# Patient Record
Sex: Female | Born: 1995 | Hispanic: No | Smoking: Former smoker
Health system: Southern US, Community
[De-identification: ages and names within clinical notes are randomized; demographics above are authoritative.]

## PROBLEM LIST (undated history)

## (undated) ENCOUNTER — Inpatient Hospital Stay (HOSPITAL_COMMUNITY): Payer: Self-pay

## (undated) DIAGNOSIS — B351 Tinea unguium: Secondary | ICD-10-CM

## (undated) DIAGNOSIS — F419 Anxiety disorder, unspecified: Secondary | ICD-10-CM

## (undated) DIAGNOSIS — N921 Excessive and frequent menstruation with irregular cycle: Secondary | ICD-10-CM

## (undated) DIAGNOSIS — E282 Polycystic ovarian syndrome: Secondary | ICD-10-CM

## (undated) DIAGNOSIS — E119 Type 2 diabetes mellitus without complications: Secondary | ICD-10-CM

## (undated) DIAGNOSIS — D649 Anemia, unspecified: Secondary | ICD-10-CM

## (undated) DIAGNOSIS — R635 Abnormal weight gain: Secondary | ICD-10-CM

## (undated) DIAGNOSIS — E785 Hyperlipidemia, unspecified: Secondary | ICD-10-CM

## (undated) HISTORY — DX: Anemia, unspecified: D64.9

## (undated) HISTORY — DX: Excessive and frequent menstruation with irregular cycle: N92.1

## (undated) HISTORY — PX: WISDOM TOOTH EXTRACTION: SHX21

## (undated) HISTORY — DX: Polycystic ovarian syndrome: E28.2

## (undated) HISTORY — DX: Abnormal weight gain: R63.5

## (undated) HISTORY — DX: Hyperlipidemia, unspecified: E78.5

## (undated) HISTORY — DX: Tinea unguium: B35.1

---
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
## 2015-04-04 DIAGNOSIS — D649 Anemia, unspecified: Secondary | ICD-10-CM

## 2015-04-04 HISTORY — DX: Anemia, unspecified: D64.9

## 2015-04-08 ENCOUNTER — Ambulatory Visit: Payer: Self-pay | Attending: Internal Medicine

## 2015-05-13 ENCOUNTER — Ambulatory Visit (INDEPENDENT_AMBULATORY_CARE_PROVIDER_SITE_OTHER): Payer: Self-pay | Admitting: Family Medicine

## 2015-05-13 ENCOUNTER — Encounter: Payer: Self-pay | Admitting: Family Medicine

## 2015-05-13 VITALS — BP 123/58 | HR 81 | Temp 98.7°F | Resp 16 | Ht 64.5 in | Wt 181.0 lb

## 2015-05-13 DIAGNOSIS — R635 Abnormal weight gain: Secondary | ICD-10-CM

## 2015-05-13 DIAGNOSIS — N921 Excessive and frequent menstruation with irregular cycle: Secondary | ICD-10-CM

## 2015-05-13 DIAGNOSIS — G4700 Insomnia, unspecified: Secondary | ICD-10-CM

## 2015-05-13 LAB — COMPLETE METABOLIC PANEL WITH GFR
ALT: 20 U/L (ref 6–29)
AST: 17 U/L (ref 10–30)
Albumin: 4.3 g/dL (ref 3.6–5.1)
Alkaline Phosphatase: 46 U/L (ref 33–115)
BILIRUBIN TOTAL: 0.4 mg/dL (ref 0.2–1.2)
BUN: 8 mg/dL (ref 7–25)
CALCIUM: 9.2 mg/dL (ref 8.6–10.2)
CO2: 21 mmol/L (ref 20–31)
CREATININE: 0.65 mg/dL (ref 0.50–1.10)
Chloride: 105 mmol/L (ref 98–110)
GFR, Est Non African American: >89 mL/min (ref >=60)
Glucose, Bld: 73 mg/dL (ref 65–99)
Potassium: 3.7 mmol/L (ref 3.5–5.3)
Sodium: 137 mmol/L (ref 135–146)
TOTAL PROTEIN: 7.5 g/dL (ref 6.1–8.1)

## 2015-05-13 LAB — TSH: TSH: 1.31 mIU/L

## 2015-05-13 LAB — CBC WITH DIFFERENTIAL/PLATELET
BASOS ABS: 0 10*3/uL (ref 0.0–0.1)
Basophils Relative: 0 % (ref 0–1)
EOS ABS: 0.1 10*3/uL (ref 0.0–0.7)
EOS PCT: 1 % (ref 0–5)
HCT: 34.9 % — ABNORMAL LOW (ref 36.0–46.0)
Hemoglobin: 11 g/dL — ABNORMAL LOW (ref 12.0–15.0)
LYMPHS ABS: 2.5 10*3/uL (ref 0.7–4.0)
Lymphocytes Relative: 29 % (ref 12–46)
MCH: 24.5 pg — AB (ref 26.0–34.0)
MCHC: 31.5 g/dL (ref 30.0–36.0)
MCV: 77.7 fL — AB (ref 78.0–100.0)
MONO ABS: 0.4 10*3/uL (ref 0.1–1.0)
MPV: 9.5 fL (ref 8.6–12.4)
Monocytes Relative: 5 % (ref 3–12)
Neutro Abs: 5.7 10*3/uL (ref 1.7–7.7)
Neutrophils Relative %: 65 % (ref 43–77)
PLATELETS: 412 10*3/uL — AB (ref 150–400)
RBC: 4.49 MIL/uL (ref 3.87–5.11)
RDW: 16.2 % — AB (ref 11.5–15.5)
WBC: 8.7 10*3/uL (ref 4.0–10.5)

## 2015-05-13 LAB — POCT URINALYSIS DIP (DEVICE)
Bilirubin Urine: NEGATIVE
GLUCOSE, UA: NEGATIVE mg/dL
Hgb urine dipstick: NEGATIVE
Ketones, ur: NEGATIVE mg/dL
LEUKOCYTES UA: NEGATIVE
NITRITE: NEGATIVE
Protein, ur: NEGATIVE mg/dL
SPECIFIC GRAVITY, URINE: 1.025 (ref 1.005–1.030)
UROBILINOGEN UA: 0.2 mg/dL (ref 0.0–1.0)
pH: 7 (ref 5.0–8.0)

## 2015-05-13 NOTE — Progress Notes (Signed)
Subjective:    Patient ID: Joy Wood, female    DOB: 09-09-1995, 20 y.o.   MRN: [id number]  HPI Joy Wood, a 20 year old female presents accompanied by sister to establish care. Patient maintains that she has not had a primary provider in the past several years. She is complaining of irregular menstrual cycle, weight gain, and insomnia. She states that menstrual cycles have been heavy and irregular since getting married 6 months ago. She states that cycles typically last 7-10 days and she does not recall the amount of pads and tampons utilized. She states that she is sexually active and has never been on birth control. She denies pelvic pain, dysuria, intolerance to heat/cold, constipation, palpitations, nausea, or vomiting. She also maintains that she has gained greater than 40 pounds over the past year. She does not report a history of hyperlipidemia, diabetes, or hypertension.  Past Medical History  Diagnosis Date  . Menorrhagia with irregular cycle   . Weight gain    Social History   Social History  . Marital Status: Single    Spouse Name: N/A  . Number of Children: N/A  . Years of Education: N/A   Occupational History  . Not on file.   Social History Main Topics  . Smoking status: Never Smoker   . Smokeless tobacco: Not on file  . Alcohol Use: No  . Drug Use: No  . Sexual Activity: Yes    Birth Control/ Protection: None   Other Topics Concern  . Not on file   Social History Narrative  . No narrative on file  No Known Allergies Review of Systems  Constitutional: Negative.  Negative for fever and fatigue.  HENT: Negative.   Eyes: Negative.   Respiratory: Negative.   Cardiovascular: Negative.   Gastrointestinal: Negative.   Endocrine: Positive for polyphagia. Negative for polydipsia and polyuria.  Musculoskeletal: Negative.   Skin: Negative.   Allergic/Immunologic: Negative for immunocompromised state.  Neurological: Negative.  Negative for dizziness.   Hematological: Negative.   Psychiatric/Behavioral: Negative.       Objective:   Physical Exam  Constitutional: She is oriented to person, place, and time. She appears well-developed and well-nourished.  HENT:  Head: Normocephalic and atraumatic.  Right Ear: External ear normal.  Left Ear: External ear normal.  Nose: Nose normal.  Mouth/Throat: Oropharynx is clear and moist.  Eyes: Conjunctivae and EOM are normal. Pupils are equal, round, and reactive to light.  Neck: Normal range of motion. Neck supple.  Cardiovascular: Normal rate, regular rhythm, normal heart sounds and intact distal pulses.   No murmur heard. Pulmonary/Chest: Effort normal and breath sounds normal.  Abdominal: Soft. Bowel sounds are normal.  Musculoskeletal: Normal range of motion.  Neurological: She is alert and oriented to person, place, and time. She has normal reflexes.  Skin: Skin is warm and dry.  Psychiatric: She has a normal mood and affect. Her behavior is normal. Judgment and thought content normal.      BP 123/58 mmHg  Pulse 81  Temp(Src) 98.7 F (37.1 C) (Oral)  Resp 16  Ht 5' 4.5" (1.638 m)  Wt 181 lb (82.101 kg)  BMI 30.60 kg/m2  LMP 04/20/2015 Assessment & Plan:  1. Weight gain Recommend a lowfat, low carbohydrate diet divided over 5-6 small meals, increase water intake to 6-8 glasses, and 150 minutes per week of cardiovascular exercise.  The patient is asked to make an attempt to improve diet and exercise patterns to aid in medical  management of this problem. - POCT urinalysis dipstick - COMPLETE METABOLIC PANEL WITH GFR - CBC with Differential - TSH - Hemoglobin A1c  2. Menorrhagia with irregular cycle  - FSH/LH  - Testosterone total  3. Insomnia Establish a sleep routine. Refrain from sleeping with television on. Refrain from using cell phone or computer in bed. Do not eat heavy meals within an hour of laying down. Patient expressed understanding.    Joy Maroon, FNP

## 2015-05-13 NOTE — Patient Instructions (Addendum)
Will call with laboratory values.  Recommend a lowfat, low carbohydrate diet divided over 5-6 small meals, increase water intake to 6-8 glasses, and 150 minutes per week of cardiovascular exercise.  Menorrhagia Menorrhagia is when your menstrual periods are heavy or last longer than usual.  HOME CARE  Only take medicine as told by your doctor.  Take any iron pills as told by your doctor. Heavy bleeding may cause low levels of iron in your body.  Do not take aspirin 1 week before or during your period. Aspirin can make the bleeding worse.  Lie down for a while if you change your tampon or pad more than once in 2 hours. This may help lessen the bleeding.  Eat a healthy diet and foods with iron. These foods include leafy green vegetables, meat, liver, eggs, and whole grain breads and cereals.  Do not try to lose weight. Wait until the heavy bleeding has stopped and your iron level is normal. GET HELP IF:  You soak through a pad or tampon every 1 or 2 hours, and this happens every time you have a period.  You need to use pads and tampons at the same time because you are bleeding so much.  You need to change your pad or tampon during the night.  You have a period that lasts for more than 8 days.  You pass clots bigger than 1 inch (2.5 cm) wide.  You have irregular periods that happen more or less often than once a month.  You feel dizzy or pass out (faint).  You feel very weak or tired.  You feel short of breath or feel your heart is beating too fast when you exercise.  You feel sick to your stomach (nausea) and you throw up (vomit) while you are taking your medicine.   You have watery poop (diarrhea) while you are taking your medicine.  You have any problems that may be related to the medicine you are taking.  GET HELP RIGHT AWAY IF:  You soak through 4 or more pads or tampons in 2 hours.  You have any bleeding while you are pregnant. MAKE SURE YOU:   Understand these  instructions.  Will watch your condition.  Will get help right away if you are not doing well or get worse.   This information is not intended to replace advice given to you by your health care provider. Make sure you discuss any questions you have with your health care provider.   Document Released: 12/28/2007 Document Revised: 11/20/2012 Document Reviewed: 09/19/2012 Elsevier Interactive Patient Education 2016 [employer]. Exercising to [employer] Exercising can help you to lose weight. In order to lose weight through exercise, you need to do vigorous-intensity exercise. You can tell that you are exercising with vigorous intensity if you are breathing very hard and fast and cannot hold a conversation while exercising. Moderate-intensity exercise helps to maintain your current weight. You can tell that you are exercising at a moderate level if you have a higher heart rate and faster breathing, but you are still able to hold a conversation. HOW OFTEN SHOULD I EXERCISE? Choose an activity that you enjoy and set realistic goals. Your health care provider can help you to make an activity plan that works for you. Exercise regularly as directed by your health care provider. This may include:  Doing resistance training twice each week, such as:  Push-ups.  Sit-ups.  Lifting weights.  Using resistance bands.  Doing a given intensity of  exercise for a given amount of time. Choose from these options:  150 minutes of moderate-intensity exercise every week.  75 minutes of vigorous-intensity exercise every week.  A mix of moderate-intensity and vigorous-intensity exercise every week. Children, pregnant women, people who are out of shape, people who are overweight, and older adults may need to consult a health care provider for individual recommendations. If you have any sort of medical condition, be sure to consult your health care provider before starting a new exercise program. WHAT ARE  SOME ACTIVITIES THAT CAN HELP ME TO LOSE WEIGHT?   Walking at a rate of at least 4.5 miles an hour.  Jogging or running at a rate of 5 miles per hour.  Biking at a rate of at least 10 miles per hour.  Lap swimming.  Roller-skating or in-line skating.  Cross-country skiing.  Vigorous competitive sports, such as football, basketball, and soccer.  Jumping rope.  Aerobic dancing. HOW CAN I BE MORE ACTIVE IN MY DAY-TO-DAY ACTIVITIES?  Use the stairs instead of the elevator.  Take a walk during your lunch break.  If you drive, park your car farther away from work or school.  If you take public transportation, get off one stop early and walk the rest of the way.  Make all of your phone calls while standing up and walking around.  Get up, stretch, and walk around every 30 minutes throughout the day. WHAT GUIDELINES SHOULD I FOLLOW WHILE EXERCISING?  Do not exercise so much that you hurt yourself, feel dizzy, or get very short of breath.  Consult your health care provider prior to starting a new exercise program.  Wear comfortable clothes and shoes with good support.  Drink plenty of water while you exercise to prevent dehydration or heat stroke. Body water is lost during exercise and must be replaced.  Work out until you breathe faster and your heart beats faster.   This information is not intended to replace advice given to you by your health care provider. Make sure you discuss any questions you have with your health care provider.   Document Released: 04/22/2010 Document Revised: 04/10/2014 Document Reviewed: 08/21/2013 Elsevier Interactive Patient Education 2016 [employer]. Calorie Counting for [employer] Loss Calories are energy you get from the things you eat and drink. Your body uses this energy to keep you going throughout the day. The number of calories you eat affects your weight. When you eat more calories than your body needs, your body stores the extra calories as  fat. When you eat fewer calories than your body needs, your body burns fat to get the energy it needs. Calorie counting means keeping track of how many calories you eat and drink each day. If you make sure to eat fewer calories than your body needs, you should lose weight. In order for calorie counting to work, you will need to eat the number of calories that are right for you in a day to lose a healthy amount of weight per week. A healthy amount of weight to lose per week is usually 1-2 lb (0.5-0.9 kg). A dietitian can determine how many calories you need in a day and give you suggestions on how to reach your calorie goal.  WHAT IS MY MY PLAN? My goal is to have __________ calories per day.  If I have this many calories per day, I should lose around __________ pounds per week. WHAT DO I NEED TO KNOW ABOUT CALORIE COUNTING? In order to meet your  daily calorie goal, you will need to:  Find out how many calories are in each food you would like to eat. Try to do this before you eat.  Decide how much of the food you can eat.  Write down what you ate and how many calories it had. Doing this is called keeping a food log. WHERE DO I FIND CALORIE INFORMATION? The number of calories in a food can be found on a Nutrition Facts label. Note that all the information on a label is based on a specific serving of the food. If a food does not have a Nutrition Facts label, try to look up the calories online or ask your dietitian for help. HOW DO I DECIDE HOW MUCH TO EAT? To decide how much of the food you can eat, you will need to consider both the number of calories in one serving and the size of one serving. This information can be found on the Nutrition Facts label. If a food does not have a Nutrition Facts label, look up the information online or ask your dietitian for help. Remember that calories are listed per serving. If you choose to have more than one serving of a food, you will have to multiply the calories  per serving by the amount of servings you plan to eat. For example, the label on a package of bread might say that a serving size is 1 slice and that there are 90 calories in a serving. If you eat 1 slice, you will have eaten 90 calories. If you eat 2 slices, you will have eaten 180 calories. HOW DO I KEEP A FOOD LOG? After each meal, record the following information in your food log:  What you ate.  How much of it you ate.  How many calories it had.  Then, add up your calories. Keep your food log near you, such as in a small notebook in your pocket. Another option is to use a mobile app or website. Some programs will calculate calories for you and show you how many calories you have left each time you add an item to the log. WHAT ARE SOME CALORIE COUNTING TIPS?  Use your calories on foods and drinks that will fill you up and not leave you hungry. Some examples of this include foods like nuts and nut butters, vegetables, lean proteins, and high-fiber foods (more than 5 g fiber per serving).  Eat nutritious foods and avoid empty calories. Empty calories are calories you get from foods or beverages that do not have many nutrients, such as candy and soda. It is better to have a nutritious high-calorie food (such as an avocado) than a food with few nutrients (such as a bag of chips).  Know how many calories are in the foods you eat most often. This way, you do not have to look up how many calories they have each time you eat them.  Look out for foods that may seem like low-calorie foods but are really high-calorie foods, such as baked goods, soda, and fat-free candy.  Pay attention to calories in drinks. Drinks such as sodas, specialty coffee drinks, alcohol, and juices have a lot of calories yet do not fill you up. Choose low-calorie drinks like water and diet drinks.  Focus your calorie counting efforts on higher calorie items. Logging the calories in a garden salad that contains only  vegetables is less important than calculating the calories in a milk shake.  Find a way of tracking calories  that works for you. Get creative. Most people who are successful find ways to keep track of how much they eat in a day, even if they do not count every calorie. WHAT ARE SOME PORTION CONTROL TIPS?  Know how many calories are in a serving. This will help you know how many servings of a certain food you can have.  Use a measuring cup to measure serving sizes. This is helpful when you start out. With time, you will be able to estimate serving sizes for some foods.  Take some time to put servings of different foods on your favorite plates, bowls, and cups so you know what a serving looks like.  Try not to eat straight from a bag or box. Doing this can lead to overeating. Put the amount you would like to eat in a cup or on a plate to make sure you are eating the right portion.  Use smaller plates, glasses, and bowls to prevent overeating. This is a quick and easy way to practice portion control. If your plate is smaller, less food can fit on it.  Try not to multitask while eating, such as watching TV or using your computer. If it is time to eat, sit down at a table and enjoy your food. Doing this will help you to start recognizing when you are full. It will also make you more aware of what and how much you are eating. HOW CAN I CALORIE COUNT WHEN EATING OUT?  Ask for smaller portion sizes or child-sized portions.  Consider sharing an entree and sides instead of getting your own entree.  If you get your own entree, eat only half. Ask for a box at the beginning of your meal and put the rest of your entree in it so you are not tempted to eat it.  Look for the calories on the menu. If calories are listed, choose the lower calorie options.  Choose dishes that include vegetables, fruits, whole grains, low-fat dairy products, and lean protein. Focusing on smart food choices from each of the 5  food groups can help you stay on track at restaurants.  Choose items that are boiled, broiled, grilled, or steamed.  Choose water, milk, unsweetened iced tea, or other drinks without added sugars. If you want an alcoholic beverage, choose a lower calorie option. For example, a regular margarita can have up to 700 calories and a glass of wine has around 150.  Stay away from items that are buttered, battered, fried, or served with cream sauce. Items labeled "crispy" are usually fried, unless stated otherwise.  Ask for dressings, sauces, and syrups on the side. These are usually very high in calories, so do not eat much of them.  Watch out for salads. Many people think salads are a healthy option, but this is often not the case. Many salads come with bacon, fried chicken, lots of cheese, fried chips, and dressing. All of these items have a lot of calories. If you want a salad, choose a garden salad and ask for grilled meats or steak. Ask for the dressing on the side, or ask for olive oil and vinegar or lemon to use as dressing.  Estimate how many servings of a food you are given. For example, a serving of cooked rice is  cup or about the size of half a tennis ball or one cupcake wrapper. Knowing serving sizes will help you be aware of how much food you are eating at restaurants. The list below  tells you how big or small some common portion sizes are based on everyday objects.  1 oz--4 stacked dice.  3 oz--1 deck of cards.  1 tsp--1 dice.  1 Tbsp-- a Ping-Pong ball.  2 Tbsp--1 Ping-Pong ball.   cup--1 tennis ball or 1 cupcake wrapper.  1 cup--1 baseball.   This information is not intended to replace advice given to you by your health care provider. Make sure you discuss any questions you have with your health care provider.   Document Released: 03/20/2005 Document Revised: 04/10/2014 Document Reviewed: 01/23/2013 Elsevier Interactive Patient Education 2016 [employer] Inc[employer] Diet for  Metabolic Syndrome Metabolic syndrome is a disorder that includes at least three of these conditions:  Abdominal obesity.  Too much sugar in your blood.  High blood pressure.  Higher than normal amount of fat (lipids) in your blood.  Lower than normal level of "good" cholesterol (HDL). Following a healthy diet can help to keep metabolic syndrome under control. It can also help to prevent the development of conditions that are associated with metabolic syndrome, such as diabetes, heart disease, and stroke. Along with exercise, a healthy diet:  Helps to improve the way that the body uses insulin.  Promotes weight loss. A common goal for people with this condition is to lose at least 7 to 10 percent of their starting weight. WHAT DO I NEED TO KNOW ABOUT THIS DIET?  Use the glycemic index (GI) to plan your meals. The index tells you how quickly a food will raise your blood sugar. Choose foods that have low GI values. These foods take a longer time to raise blood sugar.  Keep track of how many calories you take in. Eating the right amount of calories will help your achieve a healthy weight.  You may want to follow a Mediterranean diet. This diet includes lots of vegetables, lean meats or fish, whole grains, fruits, and healthy oils and fats. WHAT FOODS CAN I EAT? Grains Stone-ground whole wheat. Pumpernickel bread. Whole-grain bread, crackers, tortillas, cereal, and pasta. Unsweetened oatmeal.Bulgur.Barley.Quinoa.Brown rice or wild rice. Vegetables Lettuce. Spinach. Peas. Beets. Cauliflower. Cabbage. Broccoli. Carrots. Tomatoes. Squash. Eggplant. Herbs. Peppers. Onions. Cucumbers. Brussels sprouts. Sweet potatoes. Yams. Beans. Lentils. Fruits Berries. Apples. Oranges. Grapes. Mango. Pomegranate. Kiwi. Cherries. Meats and Other Protein Sources Seafood and shellfish. Lean meats.Poultry. Tofu. Dairy Low-fat or fat-free dairy products, such as milk, yogurt, and  cheese. Beverages Water. Low-fat milk. Milk alternatives, like soy milk or almond milk. Real fruit juice. Condiments Low-sugar or sugar-free ketchup, barbecue sauce, and mayonnaise. Mustard. Relish. Fats and Oils Avocado. Canola or olive oil. Nuts and nut butters.Seeds. The items listed above may not be a complete list of recommended foods or beverages. Contact your dietitian for more options.  WHAT FOODS ARE NOT RECOMMENDED? Red meat. Palm oil and coconut oil. Processed foods. Fried foods. Alcohol. Sweetened drinks, such as iced tea and soda. Sweets. Salty foods. The items listed above may not be a complete list of foods and beverages to avoid. Contact your dietitian for more information.   This information is not intended to replace advice given to you by your health care provider. Make sure you discuss any questions you have with your health care provider.   Document Released: 08/04/2014 Document Reviewed: 08/04/2014 Elsevier Interactive Patient Education [employer]

## 2015-05-14 ENCOUNTER — Telehealth: Payer: Self-pay

## 2015-05-14 LAB — FSH/LH
FSH: 4.5 m[IU]/mL
LH: 3.8 m[IU]/mL

## 2015-05-14 LAB — HEMOGLOBIN A1C
Hgb A1c MFr Bld: 5.7 % — ABNORMAL HIGH (ref <5.7)
MEAN PLASMA GLUCOSE: 117 mg/dL — AB (ref <117)

## 2015-05-14 NOTE — Telephone Encounter (Signed)
Called and patient was not avaliable at this time. Will try later. Thanks!

## 2015-05-14 NOTE — Telephone Encounter (Signed)
-----   Message from [name], [place] sent at 05/14/2015 12:50 PM EST ----- Regarding: lab results Please inform Ms. Mccartney that hemoglobin a1C is 5.7, which is consistent with prediabetes. Recommend a lowfat, low carbohydrate diet divided over 5-6 small meals, increase water intake to 6-8 glasses, and 150 minutes per week of cardiovascular exercise.  Also, hemoglobin is mildly decreased at 11, a normal range is 12.5. Recommend that she increase green, leafy veggies and beans. Add a multivitamin to daily regimen.   [name], FNP ----- Message -----    From: [facility] in [facility]    Sent: 05/14/2015   1:02 AM      To: [name], FNP

## 2015-05-17 NOTE — Telephone Encounter (Signed)
Called and spoke with patient, advised of labs and china's recommendations. Patient states understanding and will comply. Thanks!

## 2015-05-19 ENCOUNTER — Telehealth: Payer: Self-pay | Admitting: Internal Medicine

## 2015-05-19 NOTE — Telephone Encounter (Signed)
Patient would like to be referred to [facility] , [address], [place].

## 2015-05-20 NOTE — Telephone Encounter (Signed)
This has been faxed. Thanks!

## 2015-06-14 ENCOUNTER — Telehealth: Payer: Self-pay | Admitting: *Deleted

## 2015-06-14 NOTE — Telephone Encounter (Signed)
Pt called and stated that she would like a referral for an eye exam. Patient was advised to call back in a week if she hasn't received a call by then.

## 2015-06-14 NOTE — Telephone Encounter (Signed)
[facility]. Thanks!

## 2015-07-29 ENCOUNTER — Encounter: Payer: Self-pay | Admitting: Family Medicine

## 2015-07-29 ENCOUNTER — Other Ambulatory Visit (HOSPITAL_COMMUNITY)
Admission: RE | Admit: 2015-07-29 | Discharge: 2015-07-29 | Disposition: A | Discharge status: Home / Self Care | Payer: No Typology Code available for payment source | Source: Ambulatory Visit | Attending: Family Medicine | Admitting: Family Medicine

## 2015-07-29 ENCOUNTER — Ambulatory Visit (INDEPENDENT_AMBULATORY_CARE_PROVIDER_SITE_OTHER): Payer: No Typology Code available for payment source | Admitting: Family Medicine

## 2015-07-29 VITALS — BP 117/53 | HR 81 | Temp 99.1°F | Resp 16 | Ht 64.0 in | Wt 185.0 lb

## 2015-07-29 DIAGNOSIS — N760 Acute vaginitis: Secondary | ICD-10-CM | POA: Insufficient documentation

## 2015-07-29 DIAGNOSIS — Z113 Encounter for screening for infections with a predominantly sexual mode of transmission: Secondary | ICD-10-CM | POA: Insufficient documentation

## 2015-07-29 DIAGNOSIS — Z01419 Encounter for gynecological examination (general) (routine) without abnormal findings: Secondary | ICD-10-CM

## 2015-07-29 DIAGNOSIS — K0889 Other specified disorders of teeth and supporting structures: Secondary | ICD-10-CM

## 2015-07-29 NOTE — Progress Notes (Signed)
Subjective:    Patient ID: Joy Wood, female    DOB: 12-Mar-1996, 20 y.o.   MRN: [id number]  Gynecologic Exam The patient's pertinent negatives include no genital itching, genital lesions, genital odor, genital rash, missed menses, pelvic pain, vaginal bleeding or vaginal discharge. This is a new problem. The patient is experiencing no pain. She is not pregnant. Pertinent negatives include no abdominal pain, anorexia, back pain, chills, constipation, diarrhea, discolored urine, dysuria, fever, flank pain, frequency, headaches, hematuria, joint pain, joint swelling, nausea, painful intercourse, rash, sore throat, urgency or vomiting. She is sexually active. No, her partner does not have an STD. She uses nothing for contraception. Her menstrual history has been regular. There is no history of an abdominal surgery, a Cesarean section, an ectopic pregnancy, endometriosis, a gynecological surgery, menorrhagia, metrorrhagia, ovarian cysts, perineal abscess, PID, an STD, a terminated pregnancy or vaginosis.   Past Medical History  Diagnosis Date  . Menorrhagia with irregular cycle   . Weight gain   No Known Allergies   There is no immunization history on file for this patient.  Social History   Social History  . Marital Status: Single    Spouse Name: N/A  . Number of Children: N/A  . Years of Education: N/A   Occupational History  . Not on file.   Social History Main Topics  . Smoking status: Never Smoker   . Smokeless tobacco: Not on file  . Alcohol Use: No  . Drug Use: No  . Sexual Activity: Yes    Birth Control/ Protection: None   Other Topics Concern  . Not on file   Social History Narrative  . No narrative on file   Review of Systems  Constitutional: Negative for fever and chills.  HENT: Negative.  Negative for sore throat.   Eyes: Negative.   Respiratory: Negative.   Gastrointestinal: Negative for nausea, vomiting, abdominal pain, diarrhea, constipation and anorexia.   Endocrine: Negative.   Genitourinary: Negative.  Negative for dysuria, urgency, frequency, hematuria, flank pain, vaginal discharge, pelvic pain, menorrhagia and missed menses.  Musculoskeletal: Negative for back pain and joint pain.  Skin: Negative for rash.  Allergic/Immunologic: Negative.   Neurological: Negative.  Negative for headaches.       Objective:   Physical Exam  Constitutional: She appears well-developed and well-nourished.  HENT:  Head: Normocephalic and atraumatic.  Right Ear: External ear normal.  Left Ear: External ear normal.  Nose: Nose normal.  Mouth/Throat: Oropharynx is clear and moist.  Eyes: Conjunctivae and EOM are normal. Pupils are equal, round, and reactive to light.  Neck: Normal range of motion. Neck supple.  Cardiovascular: Normal rate, regular rhythm, normal heart sounds and intact distal pulses.   Pulmonary/Chest: Effort normal and breath sounds normal.  Abdominal: Soft. Bowel sounds are normal. Hernia confirmed negative in the right inguinal area and confirmed negative in the left inguinal area.  Genitourinary: No labial fusion. There is rash (Post shaving irritation; razor bumps) on the right labia. There is no tenderness, lesion or injury on the right labia. There is rash on the left labia. There is no tenderness, lesion or injury on the left labia. Uterus is not deviated, not enlarged, not fixed and not tender. Cervix exhibits friability. Left adnexum displays no mass. No erythema or tenderness in the vagina. Vaginal discharge found.  Skin: Skin is warm and dry.       BP 117/53 mmHg  Pulse 81  Temp(Src) 99.1 F (37.3 C) (Oral)  Resp  16  Ht 5\' 4"  (1.626 m)  Wt 185 lb (83.915 kg)  BMI 31.74 kg/m2  SpO2 100%  LMP 07/13/2015 Assessment & Plan:  1. Encounter for routine gynecological examination Will notify patient with laboratory results by phone - Cytology - [facility]  2. Pain, dental  - Ambulatory referral to  Dentistry   Routine Health Maintenance:  Reviewed vaccinations per [facility], will update chart.   Recommend a lowfat, low carbohydrate diet divided over 5-6 small meals, increase water intake to 6-8 glasses, and 150 minutes per week of cardiovascular exercise.    RTC: Will follow-up by phone with laboratory results.  Massie MaroonHollis,Riki Gehring M, FNP

## 2015-07-29 NOTE — Patient Instructions (Signed)
Health Maintenance, Female Adopting a healthy lifestyle and getting preventive care can go a long way to promote health and wellness. Talk with your health care provider about what schedule of regular examinations is right for you. This is a good chance for you to check in with your provider about disease prevention and staying healthy. In between checkups, there are plenty of things you can do on your own. Experts have done a lot of research about which lifestyle changes and preventive measures are most likely to keep you healthy. Ask your health care provider for more information. WEIGHT AND DIET  Eat a healthy diet  Be sure to include plenty of vegetables, fruits, low-fat dairy products, and lean protein.  Do not eat a lot of foods high in solid fats, added sugars, or salt.  Get regular exercise. This is one of the most important things you can do for your health.  Most adults should exercise for at least 150 minutes each week. The exercise should increase your heart rate and make you sweat (moderate-intensity exercise).  Most adults should also do strengthening exercises at least twice a week. This is in addition to the moderate-intensity exercise.  Maintain a healthy weight  Body mass index (BMI) is a measurement that can be used to identify possible weight problems. It estimates body fat based on height and weight. Your health care provider can help determine your BMI and help you achieve or maintain a healthy weight.  For females 20 years of age and older:   A BMI below 18.5 is considered underweight.  A BMI of 18.5 to 24.9 is normal.  A BMI of 25 to 29.9 is considered overweight.  A BMI of 30 and above is considered obese.  Watch levels of cholesterol and blood lipids  You should start having your blood tested for lipids and cholesterol at 20 years of age, then have this test every 5 years.  You may need to have your cholesterol levels checked more often if:  Your lipid  or cholesterol levels are high.  You are older than 21 years of age.  You are at high risk for heart disease.  CANCER SCREENING   Lung Cancer  Lung cancer screening is recommended for adults 55-80 years old who are at high risk for lung cancer because of a history of smoking.  A yearly low-dose CT scan of the lungs is recommended for people who:  Currently smoke.  Have quit within the past 15 years.  Have at least a 30-pack-year history of smoking. A pack year is smoking an average of one pack of cigarettes a day for 1 year.  Yearly screening should continue until it has been 15 years since you quit.  Yearly screening should stop if you develop a health problem that would prevent you from having lung cancer treatment.  Breast Cancer  Practice breast self-awareness. This means understanding how your breasts normally appear and feel.  It also means doing regular breast self-exams. Let your health care provider know about any changes, no matter how small.  If you are in your 20s or 30s, you should have a clinical breast exam (CBE) by a health care provider every 1-3 years as part of a regular health exam.  If you are 40 or older, have a CBE every year. Also consider having a breast X-ray (mammogram) every year.  If you have a family history of breast cancer, talk to your health care provider about genetic screening.  If you   are at high risk for breast cancer, talk to your health care provider about having an MRI and a mammogram every year.  Breast cancer gene (BRCA) assessment is recommended for women who have family members with BRCA-related cancers. BRCA-related cancers include:  Breast.  Ovarian.  Tubal.  Peritoneal cancers.  Results of the assessment will determine the need for genetic counseling and BRCA1 and BRCA2 testing. Cervical Cancer Your health care provider may recommend that you be screened regularly for cancer of the pelvic organs (ovaries, uterus, and  vagina). This screening involves a pelvic examination, including checking for microscopic changes to the surface of your cervix (Pap test). You may be encouraged to have this screening done every 3 years, beginning at age 21.  For women ages 30-65, health care providers may recommend pelvic exams and Pap testing every 3 years, or they may recommend the Pap and pelvic exam, combined with testing for human papilloma virus (HPV), every 5 years. Some types of HPV increase your risk of cervical cancer. Testing for HPV may also be done on women of any age with unclear Pap test results.  Other health care providers may not recommend any screening for nonpregnant women who are considered low risk for pelvic cancer and who do not have symptoms. Ask your health care provider if a screening pelvic exam is right for you.  If you have had past treatment for cervical cancer or a condition that could lead to cancer, you need Pap tests and screening for cancer for at least 20 years after your treatment. If Pap tests have been discontinued, your risk factors (such as having a new sexual partner) need to be reassessed to determine if screening should resume. Some women have medical problems that increase the chance of getting cervical cancer. In these cases, your health care provider may recommend more frequent screening and Pap tests. Colorectal Cancer  This type of cancer can be detected and often prevented.  Routine colorectal cancer screening usually begins at 21 years of age and continues through 21 years of age.  Your health care provider may recommend screening at an earlier age if you have risk factors for colon cancer.  Your health care provider may also recommend using home test kits to check for hidden blood in the stool.  A small camera at the end of a tube can be used to examine your colon directly (sigmoidoscopy or colonoscopy). This is done to check for the earliest forms of colorectal  cancer.  Routine screening usually begins at age 50.  Direct examination of the colon should be repeated every 5-10 years through 21 years of age. However, you may need to be screened more often if early forms of precancerous polyps or small growths are found. Skin Cancer  Check your skin from head to toe regularly.  Tell your health care provider about any new moles or changes in moles, especially if there is a change in a mole's shape or color.  Also tell your health care provider if you have a mole that is larger than the size of a pencil eraser.  Always use sunscreen. Apply sunscreen liberally and repeatedly throughout the day.  Protect yourself by wearing long sleeves, pants, a wide-brimmed hat, and sunglasses whenever you are outside. HEART DISEASE, DIABETES, AND HIGH BLOOD PRESSURE   High blood pressure causes heart disease and increases the risk of stroke. High blood pressure is more likely to develop in:  People who have blood pressure in the high end   of the normal range (130-139/85-89 mm Hg).  People who are overweight or obese.  People who are African American.  If you are 38-23 years of age, have your blood pressure checked every 3-5 years. If you are 61 years of age or older, have your blood pressure checked every year. You should have your blood pressure measured twice--once when you are at a hospital or clinic, and once when you are not at a hospital or clinic. Record the average of the two measurements. To check your blood pressure when you are not at a hospital or clinic, you can use:  An automated blood pressure machine at a pharmacy.  A home blood pressure monitor.  If you are between 45 years and 39 years old, ask your health care provider if you should take aspirin to prevent strokes.  Have regular diabetes screenings. This involves taking a blood sample to check your fasting blood sugar level.  If you are at a normal weight and have a low risk for diabetes,  have this test once every three years after 21 years of age.  If you are overweight and have a high risk for diabetes, consider being tested at a younger age or more often. PREVENTING INFECTION  Hepatitis B  If you have a higher risk for hepatitis B, you should be screened for this virus. You are considered at high risk for hepatitis B if:  You were born in a country where hepatitis B is common. Ask your health care provider which countries are considered high risk.  Your parents were born in a high-risk country, and you have not been immunized against hepatitis B (hepatitis B vaccine).  You have HIV or AIDS.  You use needles to inject street drugs.  You live with someone who has hepatitis B.  You have had sex with someone who has hepatitis B.  You get hemodialysis treatment.  You take certain medicines for conditions, including cancer, organ transplantation, and autoimmune conditions. Hepatitis C  Blood testing is recommended for:  Everyone born from [birth year] through 1966.  Anyone with known risk factors for hepatitis C. Sexually transmitted infections (STIs)  You should be screened for sexually transmitted infections (STIs) including gonorrhea and chlamydia if:  You are sexually active and are younger than 21 years of age.  You are older than 21 years of age and your health care provider tells you that you are at risk for this type of infection.  Your sexual activity has changed since you were last screened and you are at an increased risk for chlamydia or gonorrhea. Ask your health care provider if you are at risk.  If you do not have HIV, but are at risk, it may be recommended that you take a prescription medicine daily to prevent HIV infection. This is called pre-exposure prophylaxis (PrEP). You are considered at risk if:  You are sexually active and do not regularly use condoms or know the HIV status of your partner(s).  You take drugs by injection.  You are sexually  active with a partner who has HIV. Talk with your health care provider about whether you are at high risk of being infected with HIV. If you choose to begin PrEP, you should first be tested for HIV. You should then be tested every 3 months for as long as you are taking PrEP.  PREGNANCY   If you are premenopausal and you may become pregnant, ask your health care provider about preconception counseling.  If you may  become pregnant, take 400 to 800 micrograms (mcg) of folic acid every day.  If you want to prevent pregnancy, talk to your health care provider about birth control (contraception). OSTEOPOROSIS AND MENOPAUSE   Osteoporosis is a disease in which the bones lose minerals and strength with aging. This can result in serious bone fractures. Your risk for osteoporosis can be identified using a bone density scan.  If you are 35 years of age or older, or if you are at risk for osteoporosis and fractures, ask your health care provider if you should be screened.  Ask your health care provider whether you should take a calcium or vitamin D supplement to lower your risk for osteoporosis.  Menopause may have certain physical symptoms and risks.  Hormone replacement therapy may reduce some of these symptoms and risks. Talk to your health care provider about whether hormone replacement therapy is right for you.  HOME CARE INSTRUCTIONS   Schedule regular health, dental, and eye exams.  Stay current with your immunizations.   Do not use any tobacco products including cigarettes, chewing tobacco, or electronic cigarettes.  If you are pregnant, do not drink alcohol.  If you are breastfeeding, limit how much and how often you drink alcohol.  Limit alcohol intake to no more than 1 drink per day for nonpregnant women. One drink equals 12 ounces of beer, 5 ounces of wine, or 1 ounces of hard liquor.  Do not use street drugs.  Do not share needles.  Ask your health care provider for help if  you need support or information about quitting drugs.  Tell your health care provider if you often feel depressed.  Tell your health care provider if you have ever been abused or do not feel safe at home.   This information is not intended to replace advice given to you by your health care provider. Make sure you discuss any questions you have with your health care provider.   Document Released: 10/03/2010 Document Revised: 04/10/2014 Document Reviewed: 02/19/2013 Elsevier Interactive Patient Education 2016 [employer] Pelvic Exam A pelvic exam is an exam of a woman's outer and inner genitals and reproductive organs. The first pelvic exam should be at age 85. Pelvic exams allow your health care provider to check on normal development and screen for health problems. Usually, a general physical exam is done first. An exam of the breasts is also done. At this visit, you can ask questions about your health, body, menstrual cycles, sex, and birth control methods. Your health care provider will also ask you questions about your health, family health, menstrual periods, immunizations, and if you are sexually active. The information shared between you and your health care provider is not shared with anyone else. WHAT ARE THE REASONS TO HAVE A PELVIC EXAM? One reason for a pelvic exam is to screen for cancer of the ovaries, uterus, and vagina (pelvic organs). Annual (once a year) pelvic exams to screen for cancer are no longer recommended for nonpregnant women who are considered low risk for cancer of the pelvic organs and who do not have symptoms. These are sometimes called Pap tests. For low-risk women, pelvic exam cancer screening should be done:  Every 3 years for women ages 21-29.  Every 3 years for women ages 58-65, or every 5 years in combination with screening for human papilloma virus (HPV). Some types of HPV increase your risk of cervical cancer. Testing for HPV may also be done on women of any  age  with unclear Pap test results.  Some women have medical problems that increase the chance of getting cervical cancer. Talk to your health care provider about these problems. It is especially important to talk to your health care provider if a new problem develops soon after your last Pap test. In these cases, your health care provider may recommend more frequent screening and Pap tests.  The above recommendations are the same for women who have or have not gotten the vaccine for HPV, or human papillomavirus.  If you had a complete hysterectomy for a problem that was not cancer or a condition that could lead to cancer, then you no longer need Pap tests. However, even if you no longer need a Pap test, a regular exam is a good idea to make sure no other problems are starting.   If you have had past treatment for cervical cancer or a condition that could lead to cancer, you need annual Pap tests and screening for cancer for at least 20 years after your treatment.  If you are no longer receiving Pap tests, risk factors (such as a new sexual partner)need to be discussed with your health care provider to determine if screening should be started again.  Some health care providers may not recommend any screening for nonpregnant women who are considered low risk for pelvic cancer and who do not have symptoms. Ask your health care provider if a screening pelvic exam is right for you. Other reasons your health care provider might recommend a pelvic exam could include:   If you are at high risk for cervical cancer.  To make sure your female organs are normal and functioning correctly.  To check on body changes that suggest a reproductive system cancer.  To explore why you are not able to get pregnant (infertility).  To find a cause for vaginal discharge, itching, or burning.  To look for causes of why you cannot hold your urine (urinary incontinence).  To look for causes of sexual  problems.  To look for signs of sexually transmitted infection (STI).  To follow the progression of labor. Your health care provider can check on the baby and how far your cervix has opened.  To determine if pregnancy is present or how far advanced the pregnancy is.  If you have:  severe cramping or pain during your menstrual periods.  pain during sexual intercourse.  abnormal menstrual periods.  no menstrual period by the age of 97. HOW IS A PELVIC EXAM PERFORMED?  A pelvic exam is usually painless but may cause mild discomfort. In unusual circumstances or in young girls, medicines may be used for comfort. A pelvic exam is not done routinely before a girl is sexually active. Special circumstances such as rape, trauma, or medical problems may require an exam. Below is what you can expect during a pelvic exam.  You will remove all your clothes and will be given a gown. Usually, there is a nurse in the room during the exam and you can have someone from your family with you also.  The general physical exam will be done first.  Before the pelvic exam starts, you will lie down on your back on a special table. You will put the heels of your feet into foot rests (stirrups) with your legs apart. A gown, cloth, or paper drape is usually placed over your abdomen and legs.  First, the health care provider checks your outer genitals to make sure the arrangement of body parts is  normal. This includes the clitoris, vaginal opening, hymen, labia, and the perineal area between the vagina and rectum. The labia are the skin folds surrounding the vaginal opening. The tube that carries urine (urethra) is also examined.  An internal exam is done next. First, the health care provider inserts an instrument called a speculum into the vagina. The speculum has lubricant on it. The speculum helps hold the vaginal walls apart. The health care provider can then examine the vagina and cervix, which is the opening to  the uterus. Cultures of any discharge may be taken to check for an infection. A Pap test may be done.  After the internal exam is done, the speculum is removed. Your health care provider will use latex gloves with a lubricant on his or her fingers to gently press against various pelvic organs from inside the vagina while his or her other hand is on your lower belly. Your health care provider will note any tenderness or abnormalities.  Following the exam, you will get dressed and can speak with your health care provider.  Ask your health care provider when and how often you should return for future visits. Finding Out the Results of Your Test Ask when your test results will be ready. Make sure you get your test results. HOW DO I CONTINUE A HEALTHY LIFESTYLE?  Follow your health care provider's advice regarding follow-up and future visits.  Get the necessary immunizations according to your age and any traveling you may do.  Eat a balanced, nourishing diet.  Get plenty of rest and sleep.  Exercise regularly.  Maintain a healthy weight.  Do not smoke or take illegal drugs.  Drink alcohol in moderation or not at all.  If you are sexually active, use some form of birth control if you do not plan to get pregnant.  If you are sexually active, practice safe sex by using a condom to protect against sexually transmitted disease (STD).  Get help or counseling if you have emotional problems.   This information is not intended to replace advice given to you by your health care provider. Make sure you discuss any questions you have with your health care provider.   Document Released: 06/10/2002 Document Revised: 04/10/2014 Document Reviewed: 06/16/2009 Elsevier Interactive Patient Education 2016 [employer]. Pap Test WHY AM I HAVING THIS TEST? A pap test is sometimes called a pap smear. It is a screening test that is used to check for signs of cancer of the vagina, cervix, and uterus. The  test can also identify the presence of infection or precancerous changes. Your health care provider will likely recommend you have this test done on a regular basis. This test may be done:  Every 3 years, starting at age 32.  Every 5 years, in combination with testing for the presence of human papillomavirus (HPV).  More or less often depending on other medical conditions.  WHAT KIND OF SAMPLE IS TAKEN? Using a small cotton swab, plastic spatula, or brush, your health care provider will collect a sample of cells from the surface of your cervix. Your cervix is the opening to your uterus, also called a womb. Secretions from the cervix and vagina may also be collected. HOW DO I PREPARE FOR THE TEST?  Be aware of where you are in your menstrual cycle. You may be asked to reschedule the test if you are menstruating on the day of the test.  You may need to reschedule if you have a known vaginal infection  on the day of the test.  You may be asked to avoid douching or taking a bath the day before or the day of the test.  Some medicines can cause abnormal test results, such as digitalis and tetracycline. Talk with your health care provider before your test if you take one of these medicines. WHAT DO THE RESULTS MEAN? Abnormal test results may indicate a number of health conditions. These may include:  Cancer. Although pap test results cannot be used to diagnose cancer of the cervix, vagina, or uterus, they may suggest the possibility of cancer. Further tests would be required to determine if cancer is present.  Sexually transmitted disease.  Fungal infection.  Parasite infection.  Herpes infection.  A condition causing or contributing to infertility. It is your responsibility to obtain your test results. Ask the lab or department performing the test when and how you will get your results. Contact your health care provider to discuss any questions you have about your results.   This  information is not intended to replace advice given to you by your health care provider. Make sure you discuss any questions you have with your health care provider.   Document Released: 06/10/2002 Document Revised: 04/10/2014 Document Reviewed: 08/11/2013 Elsevier Interactive Patient Education [employer].

## 2015-08-02 LAB — CYTOLOGY - PAP

## 2015-08-03 ENCOUNTER — Telehealth: Payer: Self-pay

## 2015-08-03 NOTE — Telephone Encounter (Signed)
Called and spoke with patient, advised of normal pap smear and that guidelines suggest to have repeated in 3 years. Patient verbalized understanding.  Thanks!

## 2015-08-03 NOTE — Telephone Encounter (Signed)
-----   Message from Massie MaroonLachina M Hollis, [place] sent at 08/03/2015 12:51 PM EDT ----- Regarding: lab results Please inform patient that her pap smear is negative.Will repeat pap smear in 3 years per guidelines.  Please follow up in office as needed.   Thanks ----- Message -----    From: [facility]    Sent: 08/02/2015   4:22 PM      To: Massie MaroonLachina M Hollis, FNP

## 2015-08-04 LAB — CERVICOVAGINAL ANCILLARY ONLY
Bacterial vaginitis: POSITIVE — AB
Candida vaginitis: NEGATIVE
Herpes: NEGATIVE

## 2015-08-09 ENCOUNTER — Other Ambulatory Visit: Payer: Self-pay | Admitting: Family Medicine

## 2015-08-09 DIAGNOSIS — B9689 Other specified bacterial agents as the cause of diseases classified elsewhere: Secondary | ICD-10-CM

## 2015-08-09 DIAGNOSIS — N760 Acute vaginitis: Principal | ICD-10-CM

## 2015-08-09 MED ORDER — METRONIDAZOLE 500 MG PO TABS: 500.0000 mg | ORAL_TABLET | Freq: Two times a day (BID) | ORAL | Status: DC

## 2015-08-09 NOTE — Progress Notes (Signed)
Tried to call patient, no answer and voicemail has not been set up . Will Try later. Thanks!

## 2015-08-10 NOTE — Progress Notes (Signed)
HAVE been unsuccessful to reach patient by phone. I will mail letter. Thanks!

## 2015-10-08 ENCOUNTER — Ambulatory Visit: Payer: No Typology Code available for payment source | Attending: Internal Medicine

## 2016-01-13 ENCOUNTER — Ambulatory Visit (HOSPITAL_COMMUNITY)
Admission: EM | Admit: 2016-01-13 | Discharge: 2016-01-13 | Disposition: A | Discharge status: Home / Self Care | Payer: No Typology Code available for payment source | Attending: Internal Medicine | Admitting: Internal Medicine

## 2016-01-13 ENCOUNTER — Encounter (HOSPITAL_COMMUNITY): Payer: Self-pay | Admitting: Emergency Medicine

## 2016-01-13 DIAGNOSIS — R5383 Other fatigue: Secondary | ICD-10-CM

## 2016-01-13 DIAGNOSIS — E8881 Metabolic syndrome: Secondary | ICD-10-CM

## 2016-01-13 DIAGNOSIS — E611 Iron deficiency: Secondary | ICD-10-CM

## 2016-01-13 MED ORDER — DOCUSATE SODIUM 100 MG PO CAPS: 100.0000 mg | ORAL_CAPSULE | Freq: Two times a day (BID) | ORAL | 0 refills | Status: DC

## 2016-01-13 MED ORDER — FERROUS SULFATE 325 (65 FE) MG PO TABS: 325.0000 mg | ORAL_TABLET | Freq: Every day | ORAL | 0 refills | Status: DC

## 2016-01-13 NOTE — ED Triage Notes (Signed)
Patient reports symptoms for a year.  Patient is feeling fatigue.  Patient took a trip to [place] recently and had blood work done while out of the country.  Patient has been back in the country for 4 days.  Patient is wanting to follow up on abnormal readings of blood work obtained in [place].  Patient reports low iron and pre-diabetes and no pcp

## 2016-01-13 NOTE — ED Provider Notes (Signed)
CSN: [id number]     Arrival date & time 01/13/16  1702 History   None    Chief Complaint  Patient presents with  . Fatigue   (Consider location/radiation/quality/duration/timing/severity/associated sxs/prior Treatment) Patient presents with labs from [place] showing elevated insulin level and normal Lh/FSH labs She is tired and she was told her iron level is low as well.   The history is provided by the patient.  Illness  Severity:  Mild Onset quality:  Sudden Timing:  Constant Progression:  Unchanged Context:  Elevated insulin level and low iron level from labs from [place] Associated symptoms: fatigue     Past Medical History:  Diagnosis Date  . Menorrhagia with irregular cycle   . Weight gain    Past Surgical History:  Procedure Laterality Date  . WISDOM TOOTH EXTRACTION     Family History  Problem Relation Age of Onset  . Anemia Mother   . Diabetes Father   . Hyperlipidemia Father    Social History  Substance Use Topics  . Smoking status: Never Smoker  . Smokeless tobacco: Not on file  . Alcohol use No   OB History    No data available     Review of Systems  Constitutional: Positive for fatigue.  HENT: Negative.   Eyes: Negative.   Respiratory: Negative.   Cardiovascular: Negative.   Gastrointestinal: Negative.   Endocrine: Negative.   Genitourinary: Negative.   Musculoskeletal: Negative.   Skin: Negative.   Allergic/Immunologic: Negative.   Neurological: Negative.   Hematological: Negative.   Psychiatric/Behavioral: Negative.     Allergies  Review of patient's allergies indicates no known allergies.  Home Medications   Prior to Admission medications   Medication Sig Start Date End Date Taking? Authorizing Provider  metroNIDAZOLE (FLAGYL) 500 MG tablet Take 1 tablet (500 mg total) by mouth 2 (two) times daily. 08/09/15   Massie MaroonLachina M Hollis, FNP   Meds Ordered and Administered this Visit  Medications - No data to display  LMP 12/29/2015  No  data found.   Physical Exam  Constitutional: She is oriented to person, place, and time. She appears well-developed and well-nourished.  HENT:  Head: Normocephalic and atraumatic.  Eyes: EOM are normal. Pupils are equal, round, and reactive to light.  Neck: Normal range of motion.  Cardiovascular: Normal rate, regular rhythm and normal heart sounds.   Pulmonary/Chest: Effort normal and breath sounds normal.  Abdominal: Soft.  Genitourinary: Vagina normal and uterus normal.  Musculoskeletal: Normal range of motion.  Neurological: She is alert and oriented to person, place, and time.  Nursing note and vitals reviewed.   Urgent Care Course   Clinical Course    Procedures (including critical care time)  Labs Review Labs Reviewed - No data to display  Imaging Review No results found.   Visual Acuity Review  Right Eye Distance:   Left Eye Distance:   Bilateral Distance:    Right Eye Near:   Left Eye Near:    Bilateral Near:         MDM   Iron deficiency - Ferrous sulfated 325mg  one po qd  Colace 100mg  one po bid #60  Metabolic syndrome/ Elevated insulin - Advised to get PCP and get hgbaic drawn.  Advise to avoid excessive carbs in diet.   Deatra CanterWilliam J Oxford, FNP 01/13/16 [phone]

## 2016-01-21 ENCOUNTER — Ambulatory Visit: Payer: Self-pay | Attending: Internal Medicine

## 2016-02-01 ENCOUNTER — Ambulatory Visit (INDEPENDENT_AMBULATORY_CARE_PROVIDER_SITE_OTHER): Payer: Self-pay | Admitting: Internal Medicine

## 2016-02-01 ENCOUNTER — Encounter: Payer: Self-pay | Admitting: Internal Medicine

## 2016-02-01 VITALS — BP 114/68 | HR 68 | Resp 18 | Ht 64.0 in | Wt 180.5 lb

## 2016-02-01 DIAGNOSIS — D509 Iron deficiency anemia, unspecified: Secondary | ICD-10-CM

## 2016-02-01 DIAGNOSIS — R635 Abnormal weight gain: Secondary | ICD-10-CM

## 2016-02-01 DIAGNOSIS — R7303 Prediabetes: Secondary | ICD-10-CM

## 2016-02-01 NOTE — Progress Notes (Signed)
   Subjective:    Patient ID: Joy Wood, female    DOB: 12/19/1995, 20 y.o.   MRN: [id number]  HPI   Here to establish  Was in [place] in September and seen by a physician there.  Was told she is prediabetic  Has labwork showing insulin resistance--not clear if this is an insulin level Also was told she had low iron, though I do not see that in her labwork.  Has felt very fatigued. Has felt this way for 1 year.   Describes boredom with life after getting married.   Wants to work in the [employer]make[employer] field, but never gets a return phone call from businesses where she applies for a job.  Patient admits to gaining a lot of weight since married.  Sounds like she just does not have anything to do that excites her.  Getting out for physical activity minimally.  Does not interact with neighbors in [place].  May be eating more than prior to marriage.  Current Meds  Medication Sig  . docusate sodium (COLACE) 100 MG capsule Take 1 capsule (100 mg total) by mouth every 12 (twelve) hours.  . ferrous sulfate 325 (65 FE) MG tablet Take 1 tablet (325 mg total) by mouth daily.    No Known Allergies   Past Medical History:  Diagnosis Date  . Menorrhagia with irregular cycle   . Weight gain    Past Surgical History:  Procedure Laterality Date  . WISDOM TOOTH EXTRACTION     Family History  Problem Relation Age of Onset  . Anemia Mother   . Diabetes Father   . Hyperlipidemia Father   . Hypertension Father    Social History   Social History  . Marital status: Married    Spouse name: N/A  . Number of children: 0  . Years of education: [employer]   Occupational History  . housewife    Social History Main Topics  . Smoking status: Never Smoker  . Smokeless tobacco: Never Used  . Alcohol use No  . Drug use: No  . Sexual activity: Yes    Birth control/ protection: Coitus interruptus   Other Topics Concern  . Not on file   Social History Narrative   [place] in  [place]   Married and bored at home.     Would like to be doing makeup     Review of Systems     Objective:   Physical Exam NAD HEENT: PERRL, EOMI, palpebral conjunctivae with good coloring, TMs pearly gray Neck:  Supple, no adenopathy Chest:  CTA CV: RRR with normal S1 and S2, No S3, S4 or murmur, radial and DP pulses normal and equal. Abd: S, NT, No HSM or masses, + BS       Assessment & Plan:  1. History of prediabetes:  A1C in 2/ 2017 slightly elevated as well.  Discussed a good diet and finding ways to get more physically active on a daily basis.  Also, to get to know neighbors better.   A1C, CMP  2.  History of iron deficiency anemia: CBC.  3.  Boredom:  Long discussion regarding job opportunities or volunteering to find purpose or meaning to life.  Discussed counseling with Samul DadaN. Knight, LCSW, who may be beneficial in helping her with this.  She will contemplate.

## 2016-02-02 LAB — COMPREHENSIVE METABOLIC PANEL
A/G RATIO: 1.4 (ref 1.2–2.2)
ALT: 17 IU/L (ref 0–32)
AST: 15 IU/L (ref 0–40)
Albumin: 4.6 g/dL (ref 3.5–5.5)
Alkaline Phosphatase: 61 IU/L (ref 39–117)
BUN / CREAT RATIO: 19 (ref 9–23)
BUN: 12 mg/dL (ref 6–20)
CHLORIDE: 102 mmol/L (ref 96–106)
CO2: 23 mmol/L (ref 18–29)
Calcium: 9.4 mg/dL (ref 8.7–10.2)
Creatinine, Ser: 0.62 mg/dL (ref 0.57–1.00)
GFR calc non Af Amer: 129 mL/min/{1.73_m2} (ref >59)
GFR, EST AFRICAN AMERICAN: 149 mL/min/{1.73_m2} (ref >59)
Globulin, Total: 3.4 g/dL (ref 1.5–4.5)
Glucose: 86 mg/dL (ref 65–99)
POTASSIUM: 4.6 mmol/L (ref 3.5–5.2)
Sodium: 140 mmol/L (ref 134–144)
TOTAL PROTEIN: 8 g/dL (ref 6.0–8.5)

## 2016-02-02 LAB — CBC WITH DIFFERENTIAL/PLATELET
BASOS ABS: 0 10*3/uL (ref 0.0–0.2)
Basos: 0 %
EOS (ABSOLUTE): 0.1 10*3/uL (ref 0.0–0.4)
Eos: 1 %
Hematocrit: 36.4 % (ref 34.0–46.6)
Hemoglobin: 12 g/dL (ref 11.1–15.9)
Immature Grans (Abs): 0 10*3/uL (ref 0.0–0.1)
Immature Granulocytes: 0 %
LYMPHS ABS: 2.5 10*3/uL (ref 0.7–3.1)
LYMPHS: 37 %
MCH: 26 pg — AB (ref 26.6–33.0)
MCHC: 33 g/dL (ref 31.5–35.7)
MCV: 79 fL (ref 79–97)
MONOS ABS: 0.3 10*3/uL (ref 0.1–0.9)
Monocytes: 4 %
NEUTROS ABS: 3.8 10*3/uL (ref 1.4–7.0)
Neutrophils: 58 %
PLATELETS: 426 10*3/uL — AB (ref 150–379)
RBC: 4.62 x10E6/uL (ref 3.77–5.28)
RDW: 17.3 % — AB (ref 12.3–15.4)
WBC: 6.6 10*3/uL (ref 3.4–10.8)

## 2016-02-02 LAB — HGB A1C W/O EAG: HEMOGLOBIN A1C: 5.4 % (ref 4.8–5.6)

## 2016-02-02 NOTE — Progress Notes (Signed)
Dennie Bibleat spoke with patient and informed her results as noted by Dr. Delrae AlfredMulberry.

## 2016-04-03 DIAGNOSIS — E282 Polycystic ovarian syndrome: Secondary | ICD-10-CM

## 2016-04-03 DIAGNOSIS — E785 Hyperlipidemia, unspecified: Secondary | ICD-10-CM | POA: Insufficient documentation

## 2016-04-03 HISTORY — DX: Hyperlipidemia, unspecified: E78.5

## 2016-04-03 HISTORY — DX: Polycystic ovarian syndrome: E28.2

## 2016-05-04 ENCOUNTER — Ambulatory Visit (INDEPENDENT_AMBULATORY_CARE_PROVIDER_SITE_OTHER): Payer: Self-pay | Admitting: Internal Medicine

## 2016-05-04 ENCOUNTER — Encounter: Payer: Self-pay | Admitting: Internal Medicine

## 2016-05-04 VITALS — BP 110/70 | HR 90 | Resp 12 | Ht 64.75 in | Wt 178.0 lb

## 2016-05-04 DIAGNOSIS — Z8639 Personal history of other endocrine, nutritional and metabolic disease: Secondary | ICD-10-CM

## 2016-05-04 DIAGNOSIS — Z87898 Personal history of other specified conditions: Secondary | ICD-10-CM

## 2016-05-04 NOTE — Progress Notes (Signed)
   Subjective:    Patient ID: Joy Wood, female    DOB: 08/01/1995, 21 y.o.   MRN: [id number]  HPI   1.  History of prediabetes:  Started membership at [employer].  Goes 3-4 times weekly.  Stays 30-45 minutes per week.   Still drinking soda on a rare occasion.   Eats out a lot.   Last intake was last night.  Current Meds  Medication Sig  . docusate sodium (COLACE) 100 MG capsule Take 1 capsule (100 mg total) by mouth every 12 (twelve) hours.  . ferrous sulfate 325 (65 FE) MG tablet Take 1 tablet (325 mg total) by mouth daily.    No Known Allergies      Review of Systems     Objective:   Physical Exam Lungs:  CTA CV:  RRR without murmur or rub       Assessment & Plan:  1.  Overweight:  BMI now below 30.  Encouraged avoiding eating out so frequently.  2.  Prediabetes:  A1C in October 5.4%.  Has lost weight since and making good lifestyle changes, but recommended decreased eating out as above.  3.  History of hypercholesterolemia:  Fasting today.  FLP  4.  HM:  Had Tdap per nurse and noted in [facility].  She will enter in chart  Follow up in 3 months

## 2016-05-04 NOTE — Patient Instructions (Signed)
Drink a glass of water before every meal Drink 6-8 glasses of water daily Eat three meals daily Eat a protein and healthy fat with every meal (eggs,fish, chicken, turkey and limit red meats) Eat 5 servings of vegetables daily, mix the colors Eat 2 servings of fruit daily with skin, if skin is edible Use smaller plates Put food/utensils down as you chew and swallow each bite Eat at a table with friends/family at least once daily, no TV Do not eat in front of the TV 

## 2016-05-05 LAB — LIPID PANEL W/O CHOL/HDL RATIO
Cholesterol, Total: 205 mg/dL — ABNORMAL HIGH (ref 100–199)
HDL: 38 mg/dL — ABNORMAL LOW (ref >39)
LDL CALC: 139 mg/dL — AB (ref 0–99)
Triglycerides: 138 mg/dL (ref 0–149)
VLDL CHOLESTEROL CAL: 28 mg/dL (ref 5–40)

## 2016-05-26 ENCOUNTER — Telehealth: Payer: Self-pay

## 2016-05-26 NOTE — Telephone Encounter (Signed)
Message made in error

## 2016-05-26 NOTE — Progress Notes (Signed)
noted 

## 2016-06-02 NOTE — Progress Notes (Signed)
Lab results discussed with husband

## 2016-08-01 ENCOUNTER — Ambulatory Visit: Payer: Self-pay | Admitting: Internal Medicine

## 2016-08-10 ENCOUNTER — Ambulatory Visit: Payer: Self-pay | Attending: Internal Medicine

## 2016-10-11 ENCOUNTER — Encounter: Payer: Self-pay | Admitting: Internal Medicine

## 2016-10-11 ENCOUNTER — Ambulatory Visit (INDEPENDENT_AMBULATORY_CARE_PROVIDER_SITE_OTHER): Payer: Self-pay | Admitting: Internal Medicine

## 2016-10-11 VITALS — BP 122/64 | HR 78 | Resp 12 | Ht 63.5 in | Wt 188.0 lb

## 2016-10-11 DIAGNOSIS — R5383 Other fatigue: Secondary | ICD-10-CM

## 2016-10-11 DIAGNOSIS — K5900 Constipation, unspecified: Secondary | ICD-10-CM

## 2016-10-11 DIAGNOSIS — N920 Excessive and frequent menstruation with regular cycle: Secondary | ICD-10-CM

## 2016-10-12 LAB — CBC WITH DIFFERENTIAL/PLATELET
Basophils Absolute: 0 10*3/uL (ref 0.0–0.2)
Basos: 0 %
EOS (ABSOLUTE): 0.1 10*3/uL (ref 0.0–0.4)
Eos: 1 %
HEMOGLOBIN: 12.3 g/dL (ref 11.1–15.9)
Hematocrit: 38.3 % (ref 34.0–46.6)
IMMATURE GRANS (ABS): 0 10*3/uL (ref 0.0–0.1)
IMMATURE GRANULOCYTES: 0 %
LYMPHS: 28 %
Lymphocytes Absolute: 2.4 10*3/uL (ref 0.7–3.1)
MCH: 26.9 pg (ref 26.6–33.0)
MCHC: 32.1 g/dL (ref 31.5–35.7)
MCV: 84 fL (ref 79–97)
Monocytes Absolute: 0.5 10*3/uL (ref 0.1–0.9)
Monocytes: 6 %
NEUTROS PCT: 65 %
Neutrophils Absolute: 5.6 10*3/uL (ref 1.4–7.0)
PLATELETS: 410 10*3/uL — AB (ref 150–379)
RBC: 4.58 x10E6/uL (ref 3.77–5.28)
RDW: 16.3 % — ABNORMAL HIGH (ref 12.3–15.4)
WBC: 8.5 10*3/uL (ref 3.4–10.8)

## 2016-10-12 LAB — BASIC METABOLIC PANEL
BUN / CREAT RATIO: 14 (ref 9–23)
BUN: 11 mg/dL (ref 6–20)
CO2: 22 mmol/L (ref 20–29)
CREATININE: 0.77 mg/dL (ref 0.57–1.00)
Calcium: 9.6 mg/dL (ref 8.7–10.2)
Chloride: 102 mmol/L (ref 96–106)
GFR calc Af Amer: 127 mL/min/{1.73_m2} (ref >59)
GFR calc non Af Amer: 110 mL/min/{1.73_m2} (ref >59)
GLUCOSE: 66 mg/dL (ref 65–99)
POTASSIUM: 4.9 mmol/L (ref 3.5–5.2)
SODIUM: 141 mmol/L (ref 134–144)

## 2016-10-12 LAB — TSH: TSH: 1.79 u[IU]/mL (ref 0.450–4.500)

## 2016-11-14 ENCOUNTER — Telehealth: Payer: Self-pay | Admitting: Internal Medicine

## 2016-11-14 NOTE — Telephone Encounter (Signed)
Patient would like lab results.

## 2016-11-14 NOTE — Telephone Encounter (Signed)
Left detailed message lab results given.

## 2016-12-07 ENCOUNTER — Encounter: Payer: Self-pay | Admitting: Internal Medicine

## 2016-12-07 NOTE — Progress Notes (Signed)
   Subjective:    Patient ID: Joy Wood, female    DOB: 08/14/1995, 21 y.o.   MRN: [id number]  HPI   This is transcribed from written record of Dr. Rodney Cruisearey Westermann. Please see original written record scanned in media tab.  Patient feels like she may be anemic. Patient feels really tired x 1-2 months.  Did not restart iron in Feb; heavy periods - 1wk long.  H/o iron def anemia due to heavy periods in the past.  Took iron supplements up until CBC rechecked 2/18--stopped after that.   Married 2 yrs ago and increased wt since then (40 #), also c/o constipation since that time, thirsty all due to decreased activity and increased eating.  Not wanting to take OCP's due to SE's; also now wants to start family in 1-2 Y's. Denies fever, chills, SOB, C.P., nausea, black stools, abd pain, edema, rash, jt pain  No outpatient prescriptions have been marked as taking for the 10/11/16 encounter (Office Visit) with Julieanne MansonMulberry, Bo Teicher, MD.    No Known Allergies      Review of Systems     Objective:   Physical Exam WNWD/NAD Skin:  Nl HEENT:  Nl Neck:  Nl Chest:  Clear CV:  RRR (-) M Abd/pelvic:  NT, No HSM/masses. Extrems:  No edema       Assessment & Plan:  1.  Fatigue/wt gain--Check CBC, TSH, BMP  2.  Heavy periods/h/o iron deficiency anemia--start daily MV + iron.  Planning to start a family <12 mos.  Doe not want OCP's.  (If anemic may need iron supp. Again) (discussed decreased neural tube defects with folic acid)  3.  Constipation--start daily stool softener (colace) as needed and/or Metamucil fiber supplement prn.  Check TSH.

## 2017-01-08 ENCOUNTER — Encounter: Payer: Self-pay | Admitting: Internal Medicine

## 2017-01-31 ENCOUNTER — Ambulatory Visit: Payer: Self-pay | Attending: Internal Medicine

## 2017-02-05 ENCOUNTER — Encounter: Payer: Self-pay | Admitting: Internal Medicine

## 2017-03-16 ENCOUNTER — Ambulatory Visit: Payer: Self-pay | Admitting: Internal Medicine

## 2017-03-16 ENCOUNTER — Encounter: Payer: Self-pay | Admitting: Internal Medicine

## 2017-03-16 VITALS — BP 124/78 | HR 68 | Resp 12 | Ht 63.5 in | Wt 182.0 lb

## 2017-03-16 DIAGNOSIS — D509 Iron deficiency anemia, unspecified: Secondary | ICD-10-CM

## 2017-03-16 DIAGNOSIS — E782 Mixed hyperlipidemia: Secondary | ICD-10-CM

## 2017-03-16 DIAGNOSIS — Z79899 Other long term (current) drug therapy: Secondary | ICD-10-CM

## 2017-03-16 DIAGNOSIS — Z0000 Encounter for general adult medical examination without abnormal findings: Secondary | ICD-10-CM

## 2017-03-16 DIAGNOSIS — Z630 Problems in relationship with spouse or partner: Secondary | ICD-10-CM

## 2017-03-16 DIAGNOSIS — E282 Polycystic ovarian syndrome: Secondary | ICD-10-CM

## 2017-03-16 MED ORDER — TERBINAFINE HCL 250 MG PO TABS: 250.0000 mg | ORAL_TABLET | Freq: Every day | ORAL | 0 refills | Status: DC

## 2017-03-16 NOTE — Progress Notes (Signed)
Subjective:    Patient ID: Joy Wood, female    DOB: 02/16/1996, 21 y.o.   MRN: [id number]  HPI   CPE with pap  1.  Pap:  Normal pap 07/2015 with [facility].  No family history of cervical cancer.   2.  Mammogram:  Never.  No family history of breast cancer.    3.  Osteoprevention: 1-2 servings dairy daily. Goes to gym regularly.  Out and about daily. No family history of osteoporosis.  4.  Guaiac Cards:  Never.  No family history of colon cancer.  5.  Colonoscopy: Never.  As above.  6.  Immunizations:  No influenza vaccine this year yet. Immunization History  Administered Date(s) Administered  . Tdap 05/15/2007    7.  Glucose/Cholesterol:  History of prediabetes in 2017 with A1C of 5.7%.  Dropped to 5.4% with lifestyle changes.  Cholesterol panel high in February. Lipid Panel     Component Value Date/Time   CHOL 205 (H) 05/04/2016 1458   TRIG 138 05/04/2016 1458   HDL 38 (L) 05/04/2016 1458   LDLCALC 139 (H) 05/04/2016 1458    Saw a physician in [place] and diagnosed with PCOS as well as iron deficiency anemia, low Vitamin D level as well.  Took D3 ending 2 weeks ago from July.  Review of Systems  Constitutional: Negative for activity change, appetite change, fatigue and fever.  HENT: Negative for dental problem, ear pain, hearing loss, postnasal drip, rhinorrhea and sore throat.        Grinds teeth and wears bite block.   Right jaw with pain chronically.  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  Eyes: Negative for visual disturbance.  Respiratory: Negative for cough and shortness of breath.   Cardiovascular: Negative for chest pain, palpitations and leg swelling.  Gastrointestinal: Negative for blood in stool, constipation, diarrhea, nausea and vomiting.       Low abdominal pain with periods  Endocrine: Positive for polydipsia and polyuria. Negative for cold intolerance and heat intolerance.  Genitourinary: Negative for dysuria, frequency and vaginal discharge.    Musculoskeletal: Negative for arthralgias.  Skin: Negative for rash.       Mole on palm of left hand is now palpable.  Neurological: Negative for weakness, numbness and headaches.  Hematological: Negative for adenopathy. Does not bruise/bleed easily.  Psychiatric/Behavioral: Negative for dysphoric mood. The patient is nervous/anxious (marital issues--stresses her out.).        Objective:   Physical Exam  Constitutional: She is oriented to person, place, and time. She appears well-developed and well-nourished.  HENT:  Head: Normocephalic and atraumatic.  Right Ear: Hearing, tympanic membrane, external ear and ear canal normal.  Left Ear: Hearing, tympanic membrane, external ear and ear canal normal.  Nose: Nose normal.  Mouth/Throat: Uvula is midline, oropharynx is clear and moist and mucous membranes are normal.  Eyes: Conjunctivae and EOM are normal. Pupils are equal, round, and reactive to light.  Discs sharp bilaterally  Neck: Normal range of motion and full passive range of motion without pain. Neck supple. No thyroid mass and no thyromegaly present.  Cardiovascular: Normal rate, regular rhythm, S1 normal and S2 normal. Exam reveals no S3, no S4 and no friction rub.  No murmur heard. Carotid, radial, femoral, DP and PT pulses normal and equal  Pulmonary/Chest: Effort normal and breath sounds normal. Right breast exhibits no inverted nipple, no mass, no nipple discharge, no skin change and no tenderness. Left breast exhibits no inverted nipple, no  mass, no nipple discharge, no skin change and no tenderness.  Abdominal: Soft. Bowel sounds are normal. She exhibits no mass. There is no hepatosplenomegaly. There is no tenderness. No hernia.  Genitourinary:  Genitourinary Comments: Normal external genitalia.  No uterine or adnexal mass or tenderness.  Musculoskeletal: Normal range of motion.  Lymphadenopathy:       Head (right side): No submental and no submandibular adenopathy present.        Head (left side): No submental and no submandibular adenopathy present.    She has no cervical adenopathy.    She has no axillary adenopathy.       Right: No inguinal and no supraclavicular adenopathy present.       Left: No inguinal and no supraclavicular adenopathy present.  Neurological: She is alert and oriented to person, place, and time. She has normal strength and normal reflexes. No cranial nerve deficit or sensory deficit. Coordination and gait normal.  Skin: Skin is warm and intact.  Hirsute  Toenails with white discoloration.  Great toenails with scaling and horizontal striations and smaller toenails with thickening and white discoloration.  Psychiatric: Her speech is normal and behavior is normal. Judgment and thought content normal. Her mood appears anxious. Cognition and memory are normal.              Assessment & Plan:  1.  CPE without pap: CBC, CMP Encouraged 3-4 servings low fat dairy daily with regular weight bearing physical activity.  2.  Reported recent diagnosis of PCOS while in [place].  Reportedly ultrasound showing ovarian cysts. No interest in medication at this time. History of prediabetes, which supports this diagnosis.  A1C  3.  History of anemia:  CBC  4.  Toenail onychomycosis:  Check baseline transaminases.  Terbinafine 250 mg daily for 90 days.  Hepatic profile and follow up in 6 and 12 weeks.  5.  Hyperlipidemia:  FLP  6.  Marital Stress and Anxiety:  Referral to Samul Dada. LCSW

## 2017-03-16 NOTE — Patient Instructions (Signed)
Can google "advance directives, [place]"  And bring up form from Secretary of State. Print and fill out Or can go to "5 wishes"  Which is also in Spanish and fill out--this costs $5--perhaps easier to use. Designate a Medical Power of Attorney to speak for you if you are unable to speak for yourself when ill or injured  

## 2017-03-17 LAB — CBC WITH DIFFERENTIAL/PLATELET
BASOS ABS: 0 10*3/uL (ref 0.0–0.2)
Basos: 0 %
EOS (ABSOLUTE): 0 10*3/uL (ref 0.0–0.4)
Eos: 0 %
Hematocrit: 38.9 % (ref 34.0–46.6)
Hemoglobin: 12.4 g/dL (ref 11.1–15.9)
IMMATURE GRANS (ABS): 0 10*3/uL (ref 0.0–0.1)
Immature Granulocytes: 0 %
LYMPHS ABS: 3 10*3/uL (ref 0.7–3.1)
LYMPHS: 33 %
MCH: 26.7 pg (ref 26.6–33.0)
MCHC: 31.9 g/dL (ref 31.5–35.7)
MCV: 84 fL (ref 79–97)
MONOCYTES: 4 %
Monocytes Absolute: 0.4 10*3/uL (ref 0.1–0.9)
NEUTROS ABS: 5.7 10*3/uL (ref 1.4–7.0)
Neutrophils: 63 %
PLATELETS: 419 10*3/uL — AB (ref 150–379)
RBC: 4.65 x10E6/uL (ref 3.77–5.28)
RDW: 16.1 % — ABNORMAL HIGH (ref 12.3–15.4)
WBC: 9.1 10*3/uL (ref 3.4–10.8)

## 2017-03-17 LAB — COMPREHENSIVE METABOLIC PANEL
ALBUMIN: 4.9 g/dL (ref 3.5–5.5)
ALT: 18 IU/L (ref 0–32)
AST: 15 IU/L (ref 0–40)
Albumin/Globulin Ratio: 1.6 (ref 1.2–2.2)
Alkaline Phosphatase: 57 IU/L (ref 39–117)
BUN/Creatinine Ratio: 11 (ref 9–23)
BUN: 9 mg/dL (ref 6–20)
Bilirubin Total: 0.5 mg/dL (ref 0.0–1.2)
CHLORIDE: 104 mmol/L (ref 96–106)
CO2: 23 mmol/L (ref 20–29)
CREATININE: 0.79 mg/dL (ref 0.57–1.00)
Calcium: 9.7 mg/dL (ref 8.7–10.2)
GFR calc Af Amer: 123 mL/min/{1.73_m2} (ref >59)
GFR calc non Af Amer: 107 mL/min/{1.73_m2} (ref >59)
GLUCOSE: 83 mg/dL (ref 65–99)
Globulin, Total: 3 g/dL (ref 1.5–4.5)
Potassium: 4.9 mmol/L (ref 3.5–5.2)
Sodium: 143 mmol/L (ref 134–144)
Total Protein: 7.9 g/dL (ref 6.0–8.5)

## 2017-03-17 LAB — LIPID PANEL W/O CHOL/HDL RATIO
CHOLESTEROL TOTAL: 199 mg/dL (ref 100–199)
HDL: 48 mg/dL (ref >39)
LDL CALC: 131 mg/dL — AB (ref 0–99)
TRIGLYCERIDES: 101 mg/dL (ref 0–149)
VLDL CHOLESTEROL CAL: 20 mg/dL (ref 5–40)

## 2017-03-17 LAB — HGB A1C W/O EAG: HEMOGLOBIN A1C: 5.5 % (ref 4.8–5.6)

## 2017-04-10 LAB — IRON AND TIBC
IRON SATURATION: 20 % (ref 15–55)
Iron: 86 ug/dL (ref 27–159)
Total Iron Binding Capacity: 430 ug/dL (ref 250–450)
UIBC: 344 ug/dL (ref 131–425)

## 2017-04-10 LAB — SPECIMEN STATUS REPORT

## 2017-04-12 ENCOUNTER — Other Ambulatory Visit: Payer: Self-pay | Admitting: Licensed Clinical Social Worker

## 2017-04-30 ENCOUNTER — Encounter: Payer: Self-pay | Admitting: Internal Medicine

## 2017-04-30 ENCOUNTER — Ambulatory Visit: Payer: Self-pay | Admitting: Internal Medicine

## 2017-04-30 VITALS — BP 128/82 | HR 84 | Resp 12 | Ht 63.5 in | Wt 191.0 lb

## 2017-04-30 DIAGNOSIS — Z79899 Other long term (current) drug therapy: Secondary | ICD-10-CM

## 2017-04-30 DIAGNOSIS — B351 Tinea unguium: Secondary | ICD-10-CM

## 2017-04-30 HISTORY — DX: Tinea unguium: B35.1

## 2017-04-30 NOTE — Progress Notes (Signed)
   Subjective:    Patient ID: Joy Wood, female    DOB: 07/11/1995, 22 y.o.   MRN: [id number]  HPI   Toenail onychomycosis:  Tolerating Terbinafine without problems. Does appear her nails are growing out normally.      Current Meds  Medication Sig  . CINNAMON PO Take by mouth daily.  . ferrous sulfate 325 (65 FE) MG tablet Take 1 tablet (325 mg total) by mouth daily.  Marland Kitchen. terbinafine (LAMISIL) 250 MG tablet Take 1 tablet (250 mg total) by mouth daily.    No Known Allergies    Review of Systems     Objective:   Physical Exam Toenails of both feet with distal 3rd edge involve with white flaking.  3 nails both feet, great toenails most affected. Base of nails normal.       Assessment & Plan:  Toenail onychomycosis:  Appears to be responding nicely.  Finish 90 days of Terbinafine.   Continue to treat shoes and shower floor. Return in 6 weeks for same evaluation

## 2017-04-30 NOTE — Patient Instructions (Signed)
For foot and toenail fungus:  Spray your shoes with Lysol or Lotrimin Antifungal spray when you start the medication for your feet.   Re-spray your the shoes you wear with each wearing and allow to dry before wearing again You will need to continue to spray the shoes you have during the infection of your toenails and feet have been thrown out due to wear.  Once your toenails and feet are clear of infection, the shoes you buy new do not necessarily need to be sprayed. Clean your shower floor once to twice daily with bleach containing cleaner. 

## 2017-05-01 LAB — HEPATIC FUNCTION PANEL
ALBUMIN: 4.6 g/dL (ref 3.5–5.5)
ALK PHOS: 58 IU/L (ref 39–117)
ALT: 16 IU/L (ref 0–32)
AST: 16 IU/L (ref 0–40)
Bilirubin Total: 0.3 mg/dL (ref 0.0–1.2)
Bilirubin, Direct: 0.09 mg/dL (ref 0.00–0.40)
TOTAL PROTEIN: 7.7 g/dL (ref 6.0–8.5)

## 2017-06-04 ENCOUNTER — Ambulatory Visit: Payer: Self-pay | Attending: Internal Medicine

## 2017-06-12 ENCOUNTER — Encounter: Payer: Self-pay | Admitting: Internal Medicine

## 2017-06-12 ENCOUNTER — Ambulatory Visit: Payer: Self-pay | Admitting: Internal Medicine

## 2017-06-12 VITALS — BP 110/68 | HR 78 | Resp 14 | Ht 63.5 in | Wt 190.0 lb

## 2017-06-12 DIAGNOSIS — B351 Tinea unguium: Secondary | ICD-10-CM

## 2017-06-12 DIAGNOSIS — F341 Dysthymic disorder: Secondary | ICD-10-CM

## 2017-06-12 DIAGNOSIS — E282 Polycystic ovarian syndrome: Secondary | ICD-10-CM

## 2017-06-12 DIAGNOSIS — R635 Abnormal weight gain: Secondary | ICD-10-CM

## 2017-06-12 NOTE — Progress Notes (Signed)
Subjective:    Patient ID: Joy Wood, female    DOB: 10-Jan-1996, 22 y.o.   MRN: [id number]  HPI   1.  Onychomycosis:  Toenails appear better.  Discoloration now at distal part of great toenails. She is missing her dosing a fair amount.    2. Concerns with Fertility with PCOS:  Wants to consider getting on Metformin, though not planning for pregnancy in near future. Also wanting to understand when to get started on Folic Acid prior to pregnancy.  3.  Weight gain:  Is working out daily.  Still feeling alone.  Describes her friends all with important changes in their lives.  She is mainly home along.  Up late in the morning and goes to bed late at night. She and her husband have not met with Macie Burows for marital counseling.  She thinks things are ok now.  Current Meds  Medication Sig  . Cholecalciferol (VITAMIN D3) 5000 units CAPS Take by mouth. 1 weekly  . CINNAMON PO Take by mouth daily.  . ferrous sulfate 325 (65 FE) MG tablet Take 1 tablet (325 mg total) by mouth daily.  Marland Kitchen terbinafine (LAMISIL) 250 MG tablet Take 1 tablet (250 mg total) by mouth daily.    No Known Allergies   Review of Systems     Objective:   Physical Exam Toenails:  Discoloration now just involving distal part of nails       Assessment & Plan:  1.  Onychomycosis:  Encouraged to take Terbinafine on a daily basis.  Should have been done by now with treatment or close to it. Return in 6 weeks for repeat check and Hepatic profile. To continue to treat shoes and shower.  2.  PCOS:  Discussed okay to start Folic acid at 1 mg at any time as she and her husband eventually plan for pregnancy.  To wait until she is 1-2 months beyond Terbinafine. Will discuss Metformin after completes Terbinafine.  3.  Weight Gain:  Encourage patient to add more physical activity to her daily living.  4.  ?Dysthymia:  Seems wistful of her friends and often mentions she is alone.  Encouraged her to give Macie Burows, LCSW a  call.

## 2017-07-24 ENCOUNTER — Ambulatory Visit: Payer: Self-pay | Admitting: Internal Medicine

## 2017-08-01 ENCOUNTER — Encounter: Payer: Self-pay | Admitting: Internal Medicine

## 2017-08-01 ENCOUNTER — Ambulatory Visit: Payer: Self-pay | Admitting: Internal Medicine

## 2017-08-01 VITALS — BP 124/78 | HR 78 | Resp 12 | Ht 63.5 in | Wt 190.0 lb

## 2017-08-01 DIAGNOSIS — E282 Polycystic ovarian syndrome: Secondary | ICD-10-CM

## 2017-08-01 DIAGNOSIS — B351 Tinea unguium: Secondary | ICD-10-CM

## 2017-08-01 DIAGNOSIS — Z79899 Other long term (current) drug therapy: Secondary | ICD-10-CM

## 2017-08-01 DIAGNOSIS — Z87898 Personal history of other specified conditions: Secondary | ICD-10-CM

## 2017-08-01 NOTE — Patient Instructions (Signed)
Map out when likely ovulating.   Should be about 14 days after first day of last period. Have intercourse 1-2 days before you expect to ovulate.  Consider intercourse every 2 days that week. Call if you decide to start Metformin

## 2017-08-01 NOTE — Progress Notes (Signed)
   Subjective:    Patient ID: Joy Wood, female    DOB: 16-Apr-1995, 22 y.o.   MRN: [id number]  HPI   1.  Toenail onychomycosis:  She is still not done with the course.  Has 7 more days  2.  Dysthymia:  Feels like things are going okay now with husband and her life/friends.  Current Meds  Medication Sig  . [DISCONTINUED] Cholecalciferol (VITAMIN D3) 5000 units CAPS Take by mouth. 1 weekly  . [DISCONTINUED] CINNAMON PO Take by mouth daily.  . [DISCONTINUED] docusate sodium (COLACE) 100 MG capsule Take 1 capsule (100 mg total) by mouth every 12 (twelve) hours.  . [DISCONTINUED] ferrous sulfate 325 (65 FE) MG tablet Take 1 tablet (325 mg total) by mouth daily.  . [DISCONTINUED] Multiple Vitamins-Minerals (ONE DAILY MULTIVITAMIN WOMEN) TABS Take by mouth daily.  . [DISCONTINUED] terbinafine (LAMISIL) 250 MG tablet Take 1 tablet (250 mg total) by mouth daily.      Review of Systems     Objective:   Physical Exam NAD  Toenails clear at this time.     Assessment & Plan:  1.  Toenail onychomycosis:  Resolved.  To complete course of Terbinafine.  Hepatic Profile today.   Continue shoe and shower floor care to avoid reinfection.  2.  PCOS/History of prediabetes:  Will call about whether wants to start Metformin beginning of August to improve likelihood of pregnancy.  3.  Dysthymia:  Reportedly resolved

## 2017-08-02 LAB — HEPATIC FUNCTION PANEL
ALK PHOS: 67 IU/L (ref 39–117)
ALT: 14 IU/L (ref 0–32)
AST: 14 IU/L (ref 0–40)
Albumin: 4.8 g/dL (ref 3.5–5.5)
BILIRUBIN TOTAL: 0.4 mg/dL (ref 0.0–1.2)
BILIRUBIN, DIRECT: 0.12 mg/dL (ref 0.00–0.40)
Total Protein: 7.9 g/dL (ref 6.0–8.5)

## 2017-11-07 ENCOUNTER — Encounter: Payer: Self-pay | Admitting: General Practice

## 2017-11-07 ENCOUNTER — Ambulatory Visit (INDEPENDENT_AMBULATORY_CARE_PROVIDER_SITE_OTHER): Payer: Medicaid Other

## 2017-11-07 DIAGNOSIS — Z3201 Encounter for pregnancy test, result positive: Secondary | ICD-10-CM

## 2017-11-07 LAB — POCT PREGNANCY, URINE: Preg Test, Ur: POSITIVE — AB

## 2017-11-07 NOTE — Progress Notes (Signed)
Pt here today for pregnancy test.  Resulted +.  Verified medications and encouraged pt to start taking PNV.  Pt reports LMP 08/28/17  EDD 06/04/2018  10w 1.  List of medications safe to take in pregnancy given.  Proof of pregnancy letter given to begin prenatal care.

## 2017-11-08 NOTE — Progress Notes (Signed)
Chart reviewed for nurse visit. Agree with plan of care.   Marylene LandKooistra, Kathryn Lorraine, CNM 11/08/2017 11:43 AM

## 2017-11-21 ENCOUNTER — Ambulatory Visit (INDEPENDENT_AMBULATORY_CARE_PROVIDER_SITE_OTHER): Payer: Medicaid Other | Admitting: Advanced Practice Midwife

## 2017-11-21 ENCOUNTER — Other Ambulatory Visit (HOSPITAL_COMMUNITY)
Admission: RE | Admit: 2017-11-21 | Discharge: 2017-11-21 | Disposition: A | Discharge status: Home / Self Care | Payer: Medicaid Other | Source: Ambulatory Visit | Attending: Advanced Practice Midwife | Admitting: Advanced Practice Midwife

## 2017-11-21 ENCOUNTER — Encounter: Payer: Self-pay | Admitting: Advanced Practice Midwife

## 2017-11-21 VITALS — BP 139/71 | HR 84 | Wt 189.6 lb

## 2017-11-21 DIAGNOSIS — Z3481 Encounter for supervision of other normal pregnancy, first trimester: Secondary | ICD-10-CM | POA: Diagnosis present

## 2017-11-21 DIAGNOSIS — Z3A12 12 weeks gestation of pregnancy: Secondary | ICD-10-CM | POA: Insufficient documentation

## 2017-11-21 DIAGNOSIS — Z3480 Encounter for supervision of other normal pregnancy, unspecified trimester: Secondary | ICD-10-CM

## 2017-11-21 NOTE — Patient Instructions (Signed)

## 2017-11-21 NOTE — Progress Notes (Signed)
Subjective:   Joy Wood is a 22 y.o. G1P0 at 4252w1d by early ultrasound performed in [place] while visiting her family. She is being seen today for her first obstetrical visit.  Her obstetrical history is significant for N/A. Patient does intend to breast feed. Pregnancy history fully reviewed.  Patient reports no complaints. Requests female providers, no students for any part of care. Verbalizing concerns for twin gestation as there is strong family history of twins.   HISTORY: OB History  Gravida Para Term Preterm AB Living  1 0 0 0 0 0  SAB TAB Ectopic Multiple Live Births  0 0 0 0 0    # Outcome Date GA Lbr Len/2nd Weight Sex Delivery Anes PTL Lv  1 Current             Last pap smear was done in 2016 and was normal  Past Medical History:  Diagnosis Date  . Anemia 2017   iron deficiency  . Hyperlipidemia 2018  . Menorrhagia with irregular cycle   . PCOS (polycystic ovarian syndrome) 2018   Diagnosed in [place].  History of ovarian cysts there with weight issues and insulin resistance.  . Weight gain    Past Surgical History:  Procedure Laterality Date  . WISDOM TOOTH EXTRACTION     Family History  Problem Relation Age of Onset  . Anemia Mother   . Diabetes Father   . Hyperlipidemia Father   . Hypertension Father   . Cancer Father    Social History   Tobacco Use  . Smoking status: Current Some Day Smoker  . Smokeless tobacco: Never Used  . Tobacco comment: Hookah--twice weekly  Substance Use Topics  . Alcohol use: No  . Drug use: No   No Known Allergies Current Outpatient Medications on File Prior to Visit  Medication Sig Dispense Refill  . Multiple Vitamin (MULTIVITAMIN WITH MINERALS) TABS tablet Take 1 tablet by mouth daily.     No current facility-administered medications on file prior to visit.     Review of Systems Pertinent items noted in HPI and remainder of comprehensive ROS otherwise negative.  Exam   Vitals:   11/21/17 1515  BP:  139/71  Pulse: 84  Weight: 189 lb 9.6 oz (86 kg)   Fetal Heart Rate (bpm): 166  Uterus:     Pelvic Exam: Perineum: no hemorrhoids, normal perineum   Vulva: normal external genitalia, no lesions   Vagina:  normal mucosa, normal discharge   Cervix: no lesions and normal, pap smear done.    Adnexa: normal adnexa and no mass, fullness, tenderness   Bony Pelvis: average  System: General: well-developed, well-nourished female in no acute distress   Breast:  normal appearance, no masses or tenderness   Skin: normal coloration and turgor, no rashes   Neurologic: oriented, normal, negative, normal mood   Extremities: normal strength, tone, and muscle mass, ROM of all joints is normal   HEENT PERRLA, extraocular movement intact and sclera clear, anicteric   Mouth/Teeth mucous membranes moist, pharynx normal without lesions and dental hygiene good   Neck supple and no masses   Cardiovascular: regular rate and rhythm   Respiratory:  no respiratory distress, normal breath sounds   Abdomen: soft, non-tender; bowel sounds normal; no masses,  no organomegaly     Assessment:   Pregnancy: G1P0 Patient Active Problem List   Diagnosis Date Noted  . Onychomycosis of toenail 04/30/2017  . History of prediabetes 05/04/2016  . PCOS (  polycystic ovarian syndrome) 04/03/2016  . Hyperlipidemia 04/03/2016  . Bacterial vaginosis 08/09/2015  . Menorrhagia with irregular cycle 05/13/2015  . Weight gain 05/13/2015  . Anemia 04/04/2015     Plan:  1. Supervision of other normal pregnancy, antepartum  - US OB Comp + 14 Wk; Future - Culture, OB Urine - Cystic fibrosis gene test - GC/Chlamydia probe amp ([facility] - Cytology - PAP - Obstetric Panel, Including HIV - SMN1 COPY NUMBER ANALYSIS (SMA Carrier Screen) - Genetic Screening - Hemoglobinopathy Evaluation  Initial labs drawn. Continue prenatal vitamins. Genetic Screening discussed, First trimester screen, Quad screen and NIPS:  ordered. Ultrasound discussed; fetal anatomic survey: ordered. Problem list reviewed and updated. The nature of [place] [facility] with multiple MDs and other Advanced Practice Providers was explained to patient; also emphasized that residents, students are part of our team. Routine obstetric precautions reviewed. Return in about 4 weeks (around 12/19/2017).  Clayton BiblesSamantha Johnika Escareno, [place] 11/21/17 5:16 PM

## 2017-11-22 LAB — CYTOLOGY - PAP
Adequacy: ABSENT
CHLAMYDIA, DNA PROBE: NEGATIVE
Diagnosis: NEGATIVE
NEISSERIA GONORRHEA: NEGATIVE

## 2017-11-23 LAB — CULTURE, OB URINE

## 2017-11-23 LAB — URINE CULTURE, OB REFLEX: Organism ID, Bacteria: NO GROWTH

## 2017-11-29 LAB — SMN1 COPY NUMBER ANALYSIS (SMA CARRIER SCREENING)

## 2017-11-29 LAB — HEMOGLOBINOPATHY EVALUATION
Ferritin: 17 ng/mL (ref 15–150)
HGB A2 QUANT: 2.3 % (ref 1.8–3.2)
HGB F QUANT: 0 % (ref 0.0–2.0)
HGB S: 0 %
Hgb A: 97.7 % (ref 96.4–98.8)
Hgb C: 0 %
Hgb Solubility: NEGATIVE
Hgb Variant: 0 %

## 2017-11-29 LAB — OBSTETRIC PANEL, INCLUDING HIV
ANTIBODY SCREEN: NEGATIVE
BASOS ABS: 0 10*3/uL (ref 0.0–0.2)
BASOS: 0 %
EOS (ABSOLUTE): 0 10*3/uL (ref 0.0–0.4)
Eos: 0 %
HEMATOCRIT: 38.2 % (ref 34.0–46.6)
HIV Screen 4th Generation wRfx: NONREACTIVE
Hemoglobin: 12.7 g/dL (ref 11.1–15.9)
Hepatitis B Surface Ag: NEGATIVE
Immature Grans (Abs): 0 10*3/uL (ref 0.0–0.1)
Immature Granulocytes: 0 %
LYMPHS ABS: 1.9 10*3/uL (ref 0.7–3.1)
Lymphs: 20 %
MCH: 28.2 pg (ref 26.6–33.0)
MCHC: 33.2 g/dL (ref 31.5–35.7)
MCV: 85 fL (ref 79–97)
MONOCYTES: 4 %
Monocytes Absolute: 0.4 10*3/uL (ref 0.1–0.9)
NEUTROS ABS: 7.3 10*3/uL — AB (ref 1.4–7.0)
Neutrophils: 76 %
PLATELETS: 343 10*3/uL (ref 150–450)
RBC: 4.5 x10E6/uL (ref 3.77–5.28)
RDW: 15.1 % (ref 12.3–15.4)
RPR Ser Ql: NONREACTIVE
RUBELLA: 1.25 {index} (ref >0.99)
Rh Factor: NEGATIVE
WBC: 9.6 10*3/uL (ref 3.4–10.8)

## 2017-11-29 LAB — CYSTIC FIBROSIS GENE TEST

## 2017-12-05 ENCOUNTER — Telehealth: Payer: Self-pay | Admitting: Family Medicine

## 2017-12-05 NOTE — Telephone Encounter (Signed)
Patient said was suppose to be called to schedule viability ultrasound and she havent' heard anything

## 2017-12-07 ENCOUNTER — Encounter: Payer: Self-pay | Admitting: *Deleted

## 2017-12-10 ENCOUNTER — Encounter: Payer: Self-pay | Admitting: *Deleted

## 2017-12-18 ENCOUNTER — Encounter: Payer: Self-pay | Admitting: Student

## 2017-12-18 DIAGNOSIS — O0993 Supervision of high risk pregnancy, unspecified, third trimester: Secondary | ICD-10-CM | POA: Insufficient documentation

## 2017-12-19 ENCOUNTER — Ambulatory Visit (INDEPENDENT_AMBULATORY_CARE_PROVIDER_SITE_OTHER): Payer: Medicaid Other | Admitting: Student

## 2017-12-19 VITALS — BP 124/64 | HR 79 | Wt 189.0 lb

## 2017-12-19 DIAGNOSIS — Z3400 Encounter for supervision of normal first pregnancy, unspecified trimester: Secondary | ICD-10-CM

## 2017-12-19 DIAGNOSIS — Z3402 Encounter for supervision of normal first pregnancy, second trimester: Secondary | ICD-10-CM

## 2017-12-19 NOTE — Progress Notes (Signed)
Lack of appetite and not sleeping "states she's always had a issue with sleeping"

## 2017-12-19 NOTE — Patient Instructions (Addendum)
Insomnia Insomnia is a sleep disorder that makes it difficult to fall asleep or to stay asleep. Insomnia can cause tiredness (fatigue), low energy, difficulty concentrating, mood swings, and poor performance at work or school. There are three different ways to classify insomnia:  Difficulty falling asleep.  Difficulty staying asleep.  Waking up too early in the morning.  Any type of insomnia can be long-term (chronic) or short-term (acute). Both are common. Short-term insomnia usually lasts for three months or less. Chronic insomnia occurs at least three times a week for longer than three months. What are the causes? Insomnia may be caused by another condition, situation, or substance, such as:  Anxiety.  Certain medicines.  Gastroesophageal reflux disease (GERD) or other gastrointestinal conditions.  Asthma or other breathing conditions.  Restless legs syndrome, sleep apnea, or other sleep disorders.  Chronic pain.  Menopause. This may include hot flashes.  Stroke.  Abuse of alcohol, tobacco, or illegal drugs.  Depression.  Caffeine.  Neurological disorders, such as Alzheimer disease.  An overactive thyroid (hyperthyroidism).  The cause of insomnia may not be known. What increases the risk? Risk factors for insomnia include:  Gender. Women are more commonly affected than men.  Age. Insomnia is more common as you get older.  Stress. This may involve your professional or personal life.  Income. Insomnia is more common in people with lower income.  Lack of exercise.  Irregular work schedule or night shifts.  Traveling between different time zones.  What are the signs or symptoms? If you have insomnia, trouble falling asleep or trouble staying asleep is the main symptom. This may lead to other symptoms, such as:  Feeling fatigued.  Feeling nervous about going to sleep.  Not feeling rested in the morning.  Having trouble concentrating.  Feeling  irritable, anxious, or depressed.  How is this treated? Treatment for insomnia depends on the cause. If your insomnia is caused by an underlying condition, treatment will focus on addressing the condition. Treatment may also include:  Medicines to help you sleep.  Counseling or therapy.  Lifestyle adjustments.  Follow these instructions at home:  Take medicines only as directed by your health care provider.  Keep regular sleeping and waking hours. Avoid naps.  Keep a sleep diary to help you and your health care provider figure out what could be causing your insomnia. Include: ? When you sleep. ? When you wake up during the night. ? How well you sleep. ? How rested you feel the next day. ? Any side effects of medicines you are taking. ? What you eat and drink.  Make your bedroom a comfortable place where it is easy to fall asleep: ? Put up shades or special blackout curtains to block light from outside. ? Use a white noise machine to block noise. ? Keep the temperature cool.  Exercise regularly as directed by your health care provider. Avoid exercising right before bedtime.  Use relaxation techniques to manage stress. Ask your health care provider to suggest some techniques that may work well for you. These may include: ? Breathing exercises. ? Routines to release muscle tension. ? Visualizing peaceful scenes.  Cut back on alcohol, caffeinated beverages, and cigarettes, especially close to bedtime. These can disrupt your sleep.  Do not overeat or eat spicy foods right before bedtime. This can lead to digestive discomfort that can make it hard for you to sleep.  Limit screen use before bedtime. This includes: ? Watching TV. ? Using your smartphone, tablet, and   computer.  Stick to a routine. This can help you fall asleep faster. Try to do a quiet activity, brush your teeth, and go to bed at the same time each night.  Get out of bed if you are still awake after 15 minutes  of trying to sleep. Keep the lights down, but try reading or doing a quiet activity. When you feel sleepy, go back to bed.  Make sure that you drive carefully. Avoid driving if you feel very sleepy.  Keep all follow-up appointments as directed by your health care provider. This is important. Contact a health care provider if:  You are tired throughout the day or have trouble in your daily routine due to sleepiness.  You continue to have sleep problems or your sleep problems get worse. Get help right away if:  You have serious thoughts about hurting yourself or someone else. This information is not intended to replace advice given to you by your health care provider. Make sure you discuss any questions you have with your health care provider. Document Released: 03/17/2000 Document Revised: 08/20/2015 Document Reviewed: 12/19/2013 Elsevier Interactive Patient Education  2018 [place] Education Options: [facility] Department Classes:  Childbirth education classes can help you get ready for a positive parenting experience. You can also meet other expectant parents and get free stuff for your baby. Each class runs for five weeks on the same night and costs $45 for the mother-to-be and her support person. Medicaid covers the cost if you are eligible. Call [phone] to register. [facility] Childbirth Education:  [phone] or [phone] or [email or web address]  Baby & Me Class: Discuss newborn & infant parenting and family adjustment issues with other new mothers in a relaxed environment. Each week brings a new speaker or baby-centered activity. We encourage new mothers to join [place] every Thursday at 11:00am. Babies birth until crawling. No registration or fee. Daddy [employer]: This course offers Dads-to-be the tools and knowledge needed to feel confident on their journey to becoming new fathers. Experienced dads, who have been trained as coaches,  teach dads-to-be how to hold, comfort, diaper, swaddle and play with their infant while being able to support the new mom as well. A class for men taught by men. $25/dad Big Brother/Big Sister: Let your children share in the joy of a new brother or sister in this special class designed just for them. Class includes discussion about how families care for babies: swaddling, holding, diapering, safety as well as how they can be helpful in their new role. This class is designed for children ages 96 to 80, but any age is welcome. Please register each child individually. $5/child  Mom Talk: This mom-led group offers support and connection to mothers as they journey through the adjustments and struggles of that sometimes overwhelming first year after the birth of a child. Tuesdays at 10:00am and Thursdays at 6:00pm. Babies welcome. No registration or fee. Breastfeeding Support Group: This group is a mother-to-mother support circle where moms have the opportunity to share their breastfeeding experiences. A Lactation Consultant is present for questions and concerns. Meets each Tuesday at 11:00am. No fee or registration. Breastfeeding Your Baby: Learn what to expect in the first days of breastfeeding your newborn.  This class will help you feel more confident with the skills needed to begin your breastfeeding experience. Many new mothers are concerned about breastfeeding after leaving the hospital. This class will also address the most common fears and challenges about  breastfeeding during the first few weeks, months and beyond. (call for fee) Comfort Techniques and Tour: This 2 hour interactive class will provide you the opportunity to learn & practice hands-on techniques that can help relieve some of the discomfort of labor and encourage your baby to rotate toward the best position for birth. You and your partner will be able to try a variety of labor positions with birth balls and rebozos as well as practice breathing,  relaxation, and visualization techniques. A tour of the [facility] is included with this class. $20 per registrant and support person Childbirth Class- Weekend Option: This class is a Weekend version of our Birth & Baby series. It is designed for parents who have a difficult time fitting several weeks of classes into their schedule. It covers the care of your newborn and the basics of labor and childbirth. It also includes a [place] [facility] and lunch. The class is held two consecutive days: beginning on Friday evening from 6:30 - 8:30 p.m. and the next day, Saturday from 9 a.m. - 4 p.m. (call for fee) Doren Custard Class: Interested in a waterbirth?  This informational class will help you discover whether waterbirth is the right fit for you. Education about waterbirth itself, supplies you would need and how to assemble your support team is what you can expect from this class. Some obstetrical practices require this class in order to pursue a waterbirth. (Not all obstetrical practices offer waterbirth-check with your healthcare provider.) Register only the expectant mom, but you are encouraged to bring your partner to class! Required if planning waterbirth, no fee. Infant/Child CPR: Parents, grandparents, babysitters, and friends learn Cardio-Pulmonary Resuscitation skills for infants and children. You will also learn how to treat both conscious and unconscious choking in infants and children. This Family & Friends program does not offer certification. Register each participant individually to ensure that enough mannequins are available. (Call for fee) Grandparent Love: Expecting a grandbaby? This class is for you! Learn about the latest infant care and safety recommendations and ways to support your own child as he or she transitions into the parenting role. Taught by Registered Nurses who are childbirth instructors, but most importantly...they are  grandmothers too! $10/person. Childbirth Class- Natural Childbirth: This series of 5 weekly classes is for expectant parents who want to learn and practice natural methods of coping with the process of labor and childbirth. Relaxation, breathing, massage, visualization, role of the partner, and helpful positioning are highlighted. Participants learn how to be confident in their body's ability to give birth. This class will empower and help parents make informed decisions about their own care. Includes discussion that will help new parents transition into the immediate postpartum period. [facility] is included. We suggest taking this class between 25-32 weeks, but it's only a recommendation. $75 per registrant and one support person or $30 Medicaid. Childbirth Class- 3 week Series: This option of 3 weekly classes helps you and your labor partner prepare for childbirth. Newborn care, labor & birth, cesarean birth, pain management, and comfort techniques are discussed and a [place] [facility] is included. The class meets at the same time, on the same day of the week for 3 consecutive weeks beginning with the starting date you choose. $60 for registrant and one support person.  Marvelous Multiples: Expecting twins, triplets, or more? This class covers the differences in labor, birth, parenting, and breastfeeding issues  that face multiples' parents. NICU tour is included. Led by a Certified Childbirth Educator who is the mother of twins. No fee. Caring for Baby: This class is for expectant and adoptive parents who want to learn and practice the most up-to-date newborn care for their babies. Focus is on birth through the first six weeks of life. Topics include feeding, bathing, diapering, crying, umbilical cord care, circumcision care and safe sleep. Parents learn to recognize symptoms of illness and when to call the pediatrician. Register only the  mom-to-be and your partner or support person can plan to come with you! $10 per registrant and support person Childbirth Class- online option: This online class offers you the freedom to complete a Birth and Baby series in the comfort of your own home. The flexibility of this option allows you to review sections at your own pace, at times convenient to you and your support people. It includes additional video information, animations, quizzes, and extended activities. Get organized with helpful eClass tools, checklists, and trackers. Once you register online for the class, you will receive an email within a few days to accept the invitation and begin the class when the time is right for you. The content will be available to you for 60 days. $60 for 60 days of online access for you and your support people.  Local Doulas: Natural Baby Doulas [email or web address] Tel: [phone] [email or web address] [employer] [phone] [email or web address] The Labor Hassell Halim  (also do waterbirth tub rental) [phone] [email or web address] Triad Birth Doula [phone] [email or web address].fi Sacred Rhythms  [phone] [email or web address]-[email or web address]/ [employer] (PADA) [email or web address] [place] Birth and Baby  [email or web address] Considering Waterbirth? Guide for patients at [facility] [employer]  Why consider waterbirth?  . Gentle birth for babies . Less pain medicine used in labor . May allow for passive descent/less pushing . May reduce perineal tears  . More mobility and instinctive maternal position changes . Increased maternal relaxation . Reduced blood pressure in labor  Is waterbirth safe? What are the risks of infection, drowning or other complications?  . Infection: o Very  low risk (3.7 % for tub vs 4.8% for bed) o 7 in 8000 waterbirths with documented infection o Poorly cleaned equipment most common cause o Slightly lower group B strep transmission rate  . Drowning o Maternal:  - Very low risk   - Related to seizures or fainting o Newborn:  - Very low risk. No evidence of increased risk of respiratory problems in multiple large studies - Physiological protection from breathing under water - Avoid underwater birth if there are any fetal complications - Once baby's head is out of the water, keep it out.  . Birth complication o Some reports of cord trauma, but risk decreased by bringing baby to surface gradually o No evidence of increased risk of shoulder dystocia. Mothers can usually change positions faster in water than in a bed, possibly aiding the maneuvers to free the shoulder.   You must attend a Doren Custard class at [facility]  3rd Wednesday of every month from 7-9pm  [employer] by calling [phone] or online at [email or web address]  Bring [place] the certificate from the class to your prenatal appointment  Meet with a midwife at 36 weeks to see if you can still plan a waterbirth and to sign the consent.   Purchase or rent the following supplies:   Water Birth Pool (Birth Pool in a Box or [place] for instance)  (Tubs start ~$125)  Single-use disposable tub  liner designed for your brand of tub  New garden hose labeled "lead-free", "suitable for drinking water",  Electric drain pump to remove water (We recommend 792 gallon per hour or greater pump.)   Separate garden hose to remove the dirty water  Fish net  Bathing suit top (optional)  Long-handled mirror (optional)  Places to purchase or rent supplies  GotWebTools.is for tub purchases and supplies  Waterbirthsolutions.com for tub purchases and supplies  The Labor Ladies (www.[email or web address]) $275 for tub rental/set-up & take down/kit   [employer]  [employer] (http://www.fleming.com/.htm) Information regarding doulas (labor support) who provide pool rentals  Our practice has a Birth Pool in a Box tub at the hospital that you may borrow on a first-come-first-served basis. It is your responsibility to to set up, clean and break down the tub. We cannot guarantee the availability of this tub in advance. You are responsible for bringing all accessories listed above. If you do not have all necessary supplies you cannot have a waterbirth.    Things that would prevent you from having a waterbirth:  Premature, <37wks  Previous cesarean birth  Presence of thick meconium-stained fluid  Multiple gestation (Twins, triplets, etc.)  Uncontrolled diabetes or gestational diabetes requiring medication  Hypertension requiring medication or diagnosis of pre-eclampsia  Heavy vaginal bleeding  Non-reassuring fetal heart rate  Active infection (MRSA, etc.). Group B Strep is NOT a contraindication for  waterbirth.  If your labor has to be induced and induction method requires continuous  monitoring of the baby's heart rate  Other risks/issues identified by your obstetrical provider  Please remember that birth is unpredictable. Under certain unforeseeable circumstances your provider may advise against giving birth in the tub. These decisions will be made on a case-by-case basis and with the safety of you and your baby as our highest priority.

## 2017-12-19 NOTE — Progress Notes (Signed)
   PRENATAL VISIT NOTE  Subjective:  Joy Wood is a 22 y.o. G1P0 at 5264w1d being seen today for ongoing prenatal care.  She is currently monitored for the following issues for this low-risk pregnancy and has Menorrhagia with irregular cycle; History of prediabetes; PCOS (polycystic ovarian syndrome); Hyperlipidemia; Anemia; Onychomycosis of toenail; Supervision of normal first pregnancy, antepartum; and Rh negative status during pregnancy in second trimester on their problem list.  Patient reports insomnia & constipation. Issues with both prior to pregnancy but worse now. Reports BMs every 2 days. Has tried to increase fiber in diet. Has difficulty falling asleep. Previously used melatonin with improvement but hasn't taken anything since becoming pregnant. Wondering if CBD oil is ok. .  Contractions: Not present. Vag. Bleeding: None.  Movement: Present. Denies leaking of fluid.   The following portions of the patient's history were reviewed and updated as appropriate: allergies, current medications, past family history, past medical history, past social history, past surgical history and problem list. Problem list updated.  Objective:   Vitals:   12/19/17 1108  BP: 124/64  Pulse: 79  Weight: 189 lb (85.7 kg)    Fetal Status: Fetal Heart Rate (bpm): 154   Movement: Present   Fundal height 2 FB below umbilicus  General:  Alert, oriented and cooperative. Patient is in no acute distress.  Skin: Skin is warm and dry. No rash noted.   Cardiovascular: Normal heart rate noted  Respiratory: Normal respiratory effort, no problems with respiration noted  Abdomen: Soft, gravid, appropriate for gestational age.  Pain/Pressure: Absent     Pelvic: Cervical exam deferred        Extremities: Normal range of motion.  Edema: None  Mental Status: Normal mood and affect. Normal behavior. Normal judgment and thought content.   Assessment and Plan:  Pregnancy: G1P0 at 6364w1d  1. Supervision of normal  first pregnancy, antepartum -Discussed tx of constipation including diet changes & use of daily stool softeners -Discussed tx of insomnia including sleep hygiene & OTC meds (benadryl, tylenol PM, unisom). No studies on CBD oil in pregnancy, so don't recommend at this time.  -Given info to sign up for babyscripts app & my chart  - US MFM OB COMP + 14 WK; Future - AFP, Serum, Open Spina Bifida - [facility] AMB BABYSCRIPTS OPT IN  Preterm labor symptoms and general obstetric precautions including but not limited to vaginal bleeding, contractions, leaking of fluid and fetal movement were reviewed in detail with the patient. Please refer to After Visit Summary for other counseling recommendations.  Return in about 4 weeks (around 01/16/2018) for Routine OB.  Future Appointments  Date Time Provider Department Center  01/09/2018  1:30 PM [facility] US 1 [facility]    Judeth HornErin Lui Bellis, NP

## 2017-12-21 LAB — AFP, SERUM, OPEN SPINA BIFIDA
AFP MOM: 1.32
AFP VALUE AFPOSL: 37.3 ng/mL
Gest. Age on Collection Date: 16.1 weeks
MATERNAL AGE AT EDD: 23.9 a
OSBR RISK 1 IN: 4529
TEST RESULTS AFP: NEGATIVE
Weight: 189 [lb_av]

## 2018-01-09 ENCOUNTER — Ambulatory Visit (HOSPITAL_COMMUNITY)
Admission: RE | Admit: 2018-01-09 | Discharge: 2018-01-09 | Disposition: A | Discharge status: Home / Self Care | Payer: Medicaid Other | Source: Ambulatory Visit | Attending: Student | Admitting: Student

## 2018-01-09 DIAGNOSIS — Z3A19 19 weeks gestation of pregnancy: Secondary | ICD-10-CM | POA: Diagnosis not present

## 2018-01-09 DIAGNOSIS — Z363 Encounter for antenatal screening for malformations: Secondary | ICD-10-CM | POA: Diagnosis present

## 2018-01-09 DIAGNOSIS — Z3400 Encounter for supervision of normal first pregnancy, unspecified trimester: Secondary | ICD-10-CM

## 2018-01-10 ENCOUNTER — Other Ambulatory Visit (HOSPITAL_COMMUNITY): Payer: Medicaid Other

## 2018-01-10 ENCOUNTER — Other Ambulatory Visit (HOSPITAL_COMMUNITY): Payer: Self-pay | Admitting: *Deleted

## 2018-01-10 DIAGNOSIS — Z362 Encounter for other antenatal screening follow-up: Secondary | ICD-10-CM

## 2018-01-18 ENCOUNTER — Encounter: Payer: Medicaid Other | Admitting: Student

## 2018-01-23 ENCOUNTER — Ambulatory Visit (INDEPENDENT_AMBULATORY_CARE_PROVIDER_SITE_OTHER): Payer: Medicaid Other | Admitting: Advanced Practice Midwife

## 2018-01-23 VITALS — BP 119/64 | HR 81 | Wt 199.2 lb

## 2018-01-23 DIAGNOSIS — O26892 Other specified pregnancy related conditions, second trimester: Secondary | ICD-10-CM

## 2018-01-23 DIAGNOSIS — Z6791 Unspecified blood type, Rh negative: Secondary | ICD-10-CM

## 2018-01-23 DIAGNOSIS — Z3400 Encounter for supervision of normal first pregnancy, unspecified trimester: Secondary | ICD-10-CM

## 2018-01-23 NOTE — Patient Instructions (Addendum)
[email or web address]   Rh Incompatibility Rh incompatibility is a condition that occurs during pregnancy if a woman has Rh-negative blood and her baby has Rh-positive blood. "Rh-negative" and "Rh-positive" refer to whether or not the blood has an Rh factor. An Rh factor is a specific protein found on the surface of red blood cells. If a woman has Rh factor, she is Rh-positive. If she does not have an Rh factor, she is Rh-negative. Having or not having an Rh factor does not affect the mother's general health. However, it can cause problems during pregnancy. What kind of problems can Rh incompatibility cause? During pregnancy, blood from the baby can cross into the mother's bloodstream, especially during delivery. If a mother is Rh-negative and the baby is Rh-positive, the mother's defense system will react to the baby's blood as if it was a foreign substance and will create proteins (antibodies). This is called sensitization. Once the mother is sensitized, her Rh antibodies will cross the placenta to the baby and attack the baby's Rh-positive blood as if it is a harmful substance. Rh incompatibility can also happen if the Rh-negative pregnant woman is exposed to the Rh factor during a blood transfusion with Rh-positive blood. How does this condition affect my baby? The Rh antibodies that attack and destroy the baby's red blood cells can lead to hemolytic disease in the baby. Hemolytic disease is when the red blood cells break down. This can cause:  Yellowing of the skin and eyes (jaundice).  The body to not have enough healthy red blood cells (anemia).  Brain damage.  Heart failure.  Death.  These antibodies usually do not cause problems during a first pregnancy. This is because the blood from the baby often times crosses into the mother's bloodstream during delivery, and the baby is born before many of the antibodies can develop. However, the antibodies stay in your body once they have  formed. Because of this, Rh incompatibility is more likely to cause problems in second or later pregnancies (if the baby is Rh-positive). How is this diagnosed? When a woman becomes pregnant, blood tests may be done to find out her blood type and Rh factor. If the woman is Rh-negative, she also may have another blood test called an antibody screen. The antibody screen shows whether she has Rh antibodies in her blood. If she does, it means she was exposed to Rh-positive blood before, and she is at risk for Rh incompatibility. To find out whether the baby is developing hemolytic anemia and how serious it is, caregivers may use more advanced tests, such as ultrasonography (commonly known as ultrasound). How is Rh incompatibility treated? Rh incompatibility is treated with a shot of medicine called Rho (D) immune globulin. This medicine keeps the woman's body from making antibodies that can cause serious problems in the baby or future babies. Two shots will be given, one at around your seventh month of pregnancy and the other within 72 hours of your baby being born. If you are Rh-negative, you will need this medicine every time you have a baby with Rh-positive blood. If you already have antibodies in your blood, Rho (D) immune globulin will not help. Your doctor will not give you this medicine, but will watch your pregnancy closely for problems instead. This shot may also be given to an Rh-negative woman when the risk of blood transfer between the mom and baby is high. The risk is high with:  An amniocentesis.  A miscarriage or an abortion.  An ectopic  pregnancy.  Any vaginal bleeding during pregnancy.  This information is not intended to replace advice given to you by your health care provider. Make sure you discuss any questions you have with your health care provider. Document Released: 09/09/2001 Document Revised: 08/26/2015 Document Reviewed: 07/02/2012 Elsevier Interactive Patient Education   2017 [employer].   Preterm Labor and Birth Information The normal length of a pregnancy is 39-41 weeks. Preterm labor is when labor starts before 37 completed weeks of pregnancy. What are the risk factors for preterm labor? Preterm labor is more likely to occur in women who:  Have certain infections during pregnancy such as a bladder infection, sexually transmitted infection, or infection inside the uterus (chorioamnionitis).  Have a shorter-than-normal cervix.  Have gone into preterm labor before.  Have had surgery on their cervix.  Are younger than age 12 or older than age 84.  Are African American.  Are pregnant with twins or multiple babies (multiple gestation).  Take street drugs or smoke while pregnant.  Do not gain enough weight while pregnant.  Became pregnant shortly after having been pregnant.  What are the symptoms of preterm labor? Symptoms of preterm labor include:  Cramps similar to those that can happen during a menstrual period. The cramps may happen with diarrhea.  Pain in the abdomen or lower back.  Regular uterine contractions that may feel like tightening of the abdomen.  A feeling of increased pressure in the pelvis.  Increased watery or bloody mucus discharge from the vagina.  Water breaking (ruptured amniotic sac).  Why is it important to recognize signs of preterm labor? It is important to recognize signs of preterm labor because babies who are born prematurely may not be fully developed. This can put them at an increased risk for:  Long-term (chronic) heart and lung problems.  Difficulty immediately after birth with regulating body systems, including blood sugar, body temperature, heart rate, and breathing rate.  Bleeding in the brain.  Cerebral palsy.  Learning difficulties.  Death.  These risks are highest for babies who are born before 34 weeks of pregnancy. How is preterm labor treated? Treatment depends on the length of your  pregnancy, your condition, and the health of your baby. It may involve:  Having a stitch (suture) placed in your cervix to prevent your cervix from opening too early (cerclage).  Taking or being given medicines, such as: ? Hormone medicines. These may be given early in pregnancy to help support the pregnancy. ? Medicine to stop contractions. ? Medicines to help mature the baby's lungs. These may be prescribed if the risk of delivery is high. ? Medicines to prevent your baby from developing cerebral palsy.  If the labor happens before 34 weeks of pregnancy, you may need to stay in the hospital. What should I do if I think I am in preterm labor? If you think that you are going into preterm labor, call your health care provider right away. How can I prevent preterm labor in future pregnancies? To increase your chance of having a full-term pregnancy:  Do not use any tobacco products, such as cigarettes, chewing tobacco, and e-cigarettes. If you need help quitting, ask your health care provider.  Do not use street drugs or medicines that have not been prescribed to you during your pregnancy.  Talk with your health care provider before taking any herbal supplements, even if you have been taking them regularly.  Make sure you gain a healthy amount of weight during your pregnancy.  Watch for infection. If you think that you might have an infection, get it checked right away.  Make sure to tell your health care provider if you have gone into preterm labor before.  This information is not intended to replace advice given to you by your health care provider. Make sure you discuss any questions you have with your health care provider. Document Released: 06/10/2003 Document Revised: 08/31/2015 Document Reviewed: 08/11/2015 Elsevier Interactive Patient Education  2018 [employer]. Research childbirth classes and hospital preregistration at [email or web address]  Fetal Movement Counts Patient  Name: ________________________________________________ Patient Due Date: ____________________ What is a fetal movement count? A fetal movement count is the number of times that you feel your baby move during a certain amount of time. This may also be called a fetal kick count. A fetal movement count is recommended for every pregnant woman. You may be asked to start counting fetal movements as early as week 28 of your pregnancy. Pay attention to when your baby is most active. You may notice your baby's sleep and wake cycles. You may also notice things that make your baby move more. You should do a fetal movement count:  When your baby is normally most active.  At the same time each day.  A good time to count movements is while you are resting, after having something to eat and drink. How do I count fetal movements? 1. Find a quiet, comfortable area. Sit, or lie down on your side. 2. Write down the date, the start time and stop time, and the number of movements that you felt between those two times. Take this information with you to your health care visits. 3. For 2 hours, count kicks, flutters, swishes, rolls, and jabs. You should feel at least 10 movements during 2 hours. 4. You may stop counting after you have felt 10 movements. 5. If you do not feel 10 movements in 2 hours, have something to eat and drink. Then, keep resting and counting for 1 hour. If you feel at least 4 movements during that hour, you may stop counting. Contact a health care provider if:  You feel fewer than 4 movements in 2 hours.  Your baby is not moving like he or she usually does. Date: ____________ Start time: ____________ Stop time: ____________ Movements: ____________ Date: ____________ Start time: ____________ Stop time: ____________ Movements: ____________ Date: ____________ Start time: ____________ Stop time: ____________ Movements: ____________ Date: ____________ Start time: ____________ Stop time: ____________  Movements: ____________ Date: ____________ Start time: ____________ Stop time: ____________ Movements: ____________ Date: ____________ Start time: ____________ Stop time: ____________ Movements: ____________ Date: ____________ Start time: ____________ Stop time: ____________ Movements: ____________ Date: ____________ Start time: ____________ Stop time: ____________ Movements: ____________ Date: ____________ Start time: ____________ Stop time: ____________ Movements: ____________ This information is not intended to replace advice given to you by your health care provider. Make sure you discuss any questions you have with your health care provider. Document Released: 04/19/2006 Document Revised: 11/17/2015 Document Reviewed: 04/29/2015 Elsevier Interactive Patient Education  2018 [employer].  [employer] of the uterus can occur throughout pregnancy, but they are not always a sign that you are in labor. You may have practice contractions called Braxton Hicks contractions. These false labor contractions are sometimes confused with true labor. What are Deberah Pelton contractions? Braxton Hicks contractions are tightening movements that occur in the muscles of the uterus before labor. Unlike true labor contractions, these contractions do not result in opening (dilation) and thinning  of the cervix. Toward the end of pregnancy (32-34 weeks), Braxton Hicks contractions can happen more often and may become stronger. These contractions are sometimes difficult to tell apart from true labor because they can be very uncomfortable. You should not feel embarrassed if you go to the hospital with false labor. Sometimes, the only way to tell if you are in true labor is for your health care provider to look for changes in the cervix. The health care provider will do a physical exam and may monitor your contractions. If you are not in true labor, the exam should show that your cervix is not  dilating and your water has not broken. If there are other health problems associated with your pregnancy, it is completely safe for you to be sent home with false labor. You may continue to have Braxton Hicks contractions until you go into true labor. How to tell the difference between true labor and false labor True labor  Contractions last 30-70 seconds.  Contractions become very regular.  Discomfort is usually felt in the top of the uterus, and it spreads to the lower abdomen and low back.  Contractions do not go away with walking.  Contractions usually become more intense and increase in frequency.  The cervix dilates and gets thinner. False labor  Contractions are usually shorter and not as strong as true labor contractions.  Contractions are usually irregular.  Contractions are often felt in the front of the lower abdomen and in the groin.  Contractions may go away when you walk around or change positions while lying down.  Contractions get weaker and are shorter-lasting as time goes on.  The cervix usually does not dilate or become thin. Follow these instructions at home:  Take over-the-counter and prescription medicines only as told by your health care provider.  Keep up with your usual exercises and follow other instructions from your health care provider.  Eat and drink lightly if you think you are going into labor.  If Braxton Hicks contractions are making you uncomfortable: ? Change your position from lying down or resting to walking, or change from walking to resting. ? Sit and rest in a tub of warm water. ? Drink enough fluid to keep your urine pale yellow. Dehydration may cause these contractions. ? Do slow and deep breathing several times an hour.  Keep all follow-up prenatal visits as told by your health care provider. This is important. Contact a health care provider if:  You have a fever.  You have continuous pain in your abdomen. Get help right  away if:  Your contractions become stronger, more regular, and closer together.  You have fluid leaking or gushing from your vagina.  You pass blood-tinged mucus (bloody show).  You have bleeding from your vagina.  You have low back pain that you never had before.  You feel your baby's head pushing down and causing pelvic pressure.  Your baby is not moving inside you as much as it used to. Summary  Contractions that occur before labor are called Braxton Hicks contractions, false labor, or practice contractions.  Braxton Hicks contractions are usually shorter, weaker, farther apart, and less regular than true labor contractions. True labor contractions usually become progressively stronger and regular and they become more frequent.  Manage discomfort from [facility] contractions by changing position, resting in a warm bath, drinking plenty of water, or practicing deep breathing. This information is not intended to replace advice given to you by your health care  provider. Make sure you discuss any questions you have with your health care provider. Document Released: 08/03/2016 Document Revised: 08/03/2016 Document Reviewed: 08/03/2016 Elsevier Interactive Patient Education  2018 [employer].   Research childbirth classes and hospital preregistration at [email or web address]  Fetal Movement Counts Patient Name: ________________________________________________ Patient Due Date: ____________________ What is a fetal movement count? A fetal movement count is the number of times that you feel your baby move during a certain amount of time. This may also be called a fetal kick count. A fetal movement count is recommended for every pregnant woman. You may be asked to start counting fetal movements as early as week 28 of your pregnancy. Pay attention to when your baby is most active. You may notice your baby's sleep and wake cycles. You may also notice things that make your baby move more.  You should do a fetal movement count:  When your baby is normally most active.  At the same time each day.  A good time to count movements is while you are resting, after having something to eat and drink. How do I count fetal movements? 6. Find a quiet, comfortable area. Sit, or lie down on your side. 7. Write down the date, the start time and stop time, and the number of movements that you felt between those two times. Take this information with you to your health care visits. 8. For 2 hours, count kicks, flutters, swishes, rolls, and jabs. You should feel at least 10 movements during 2 hours. 9. You may stop counting after you have felt 10 movements. 10. If you do not feel 10 movements in 2 hours, have something to eat and drink. Then, keep resting and counting for 1 hour. If you feel at least 4 movements during that hour, you may stop counting. Contact a health care provider if:  You feel fewer than 4 movements in 2 hours.  Your baby is not moving like he or she usually does. Date: ____________ Start time: ____________ Stop time: ____________ Movements: ____________ Date: ____________ Start time: ____________ Stop time: ____________ Movements: ____________ Date: ____________ Start time: ____________ Stop time: ____________ Movements: ____________ Date: ____________ Start time: ____________ Stop time: ____________ Movements: ____________ Date: ____________ Start time: ____________ Stop time: ____________ Movements: ____________ Date: ____________ Start time: ____________ Stop time: ____________ Movements: ____________ Date: ____________ Start time: ____________ Stop time: ____________ Movements: ____________ Date: ____________ Start time: ____________ Stop time: ____________ Movements: ____________ Date: ____________ Start time: ____________ Stop time: ____________ Movements: ____________ This information is not intended to replace advice given to you by your health care provider. Make  sure you discuss any questions you have with your health care provider. Document Released: 04/19/2006 Document Revised: 11/17/2015 Document Reviewed: 04/29/2015 Elsevier Interactive Patient Education  2018 [employer].  [employer] of the uterus can occur throughout pregnancy, but they are not always a sign that you are in labor. You may have practice contractions called Braxton Hicks contractions. These false labor contractions are sometimes confused with true labor. What are Deberah Pelton contractions? Braxton Hicks contractions are tightening movements that occur in the muscles of the uterus before labor. Unlike true labor contractions, these contractions do not result in opening (dilation) and thinning of the cervix. Toward the end of pregnancy (32-34 weeks), Braxton Hicks contractions can happen more often and may become stronger. These contractions are sometimes difficult to tell apart from true labor because they can be very uncomfortable. You should not feel embarrassed if you go to the  hospital with false labor. Sometimes, the only way to tell if you are in true labor is for your health care provider to look for changes in the cervix. The health care provider will do a physical exam and may monitor your contractions. If you are not in true labor, the exam should show that your cervix is not dilating and your water has not broken. If there are other health problems associated with your pregnancy, it is completely safe for you to be sent home with false labor. You may continue to have Braxton Hicks contractions until you go into true labor. How to tell the difference between true labor and false labor True labor  Contractions last 30-70 seconds.  Contractions become very regular.  Discomfort is usually felt in the top of the uterus, and it spreads to the lower abdomen and low back.  Contractions do not go away with walking.  Contractions usually become more  intense and increase in frequency.  The cervix dilates and gets thinner. False labor  Contractions are usually shorter and not as strong as true labor contractions.  Contractions are usually irregular.  Contractions are often felt in the front of the lower abdomen and in the groin.  Contractions may go away when you walk around or change positions while lying down.  Contractions get weaker and are shorter-lasting as time goes on.  The cervix usually does not dilate or become thin. Follow these instructions at home:  Take over-the-counter and prescription medicines only as told by your health care provider.  Keep up with your usual exercises and follow other instructions from your health care provider.  Eat and drink lightly if you think you are going into labor.  If Braxton Hicks contractions are making you uncomfortable: ? Change your position from lying down or resting to walking, or change from walking to resting. ? Sit and rest in a tub of warm water. ? Drink enough fluid to keep your urine pale yellow. Dehydration may cause these contractions. ? Do slow and deep breathing several times an hour.  Keep all follow-up prenatal visits as told by your health care provider. This is important. Contact a health care provider if:  You have a fever.  You have continuous pain in your abdomen. Get help right away if:  Your contractions become stronger, more regular, and closer together.  You have fluid leaking or gushing from your vagina.  You pass blood-tinged mucus (bloody show).  You have bleeding from your vagina.  You have low back pain that you never had before.  You feel your baby's head pushing down and causing pelvic pressure.  Your baby is not moving inside you as much as it used to. Summary  Contractions that occur before labor are called Braxton Hicks contractions, false labor, or practice contractions.  Braxton Hicks contractions are usually shorter,  weaker, farther apart, and less regular than true labor contractions. True labor contractions usually become progressively stronger and regular and they become more frequent.  Manage discomfort from [facility] contractions by changing position, resting in a warm bath, drinking plenty of water, or practicing deep breathing. This information is not intended to replace advice given to you by your health care provider. Make sure you discuss any questions you have with your health care provider. Document Released: 08/03/2016 Document Revised: 08/03/2016 Document Reviewed: 08/03/2016 Elsevier Interactive Patient Education  2018 [employer].

## 2018-01-23 NOTE — Progress Notes (Signed)
   PRENATAL VISIT NOTE  Subjective:  Joy Wood is a 22 y.o. G1P0 at [redacted]w[redacted]d being seen today for ongoing prenatal care.  She is currently monitored for the following issues for this low-risk pregnancy and has Menorrhagia with irregular cycle; History of prediabetes; PCOS (polycystic ovarian syndrome); Hyperlipidemia; Anemia; Onychomycosis of toenail; Supervision of normal first pregnancy, antepartum; and Rh negative status during pregnancy in second trimester on their problem list.  Patient reports stomach pain when lying down. Insomnia. .  Contractions: Not present. Vag. Bleeding: None.  Movement: Present. Denies leaking of fluid.   The following portions of the patient's history were reviewed and updated as appropriate: allergies, current medications, past family history, past medical history, past social history, past surgical history and problem list. Problem list updated.  Objective:   Vitals:   01/23/18 1014  BP: 119/64  Pulse: 81  Weight: 199 lb 3.2 oz (90.4 kg)    Fetal Status: Fetal Heart Rate (bpm): 145   Movement: Present     General:  Alert, oriented and cooperative. Patient is in no acute distress.  Skin: Skin is warm and dry. No rash noted.   Cardiovascular: Normal heart rate noted  Respiratory: Normal respiratory effort, no problems with respiration noted  Abdomen: Soft, gravid, appropriate for gestational age.  Pain/Pressure: Present     Pelvic: Cervical exam deferred        Extremities: Normal range of motion.  Edema: Trace  Mental Status: Normal mood and affect. Normal behavior. Normal judgment and thought content.   Assessment and Plan:  Pregnancy: G1P0 at [redacted]w[redacted]d  1. Supervision of normal first pregnancy, antepartum - F/U US for incomplete anatomy 11/6   2. Rh negative status during pregnancy in second trimester - plan Rhophylac at 28 weeks  Preterm labor symptoms and general obstetric precautions including but not limited to vaginal bleeding,  contractions, leaking of fluid and fetal movement were reviewed in detail with the patient. Please refer to After Visit Summary for other counseling recommendations.  No follow-ups on file.  Future Appointments  Date Time Provider Department Center  02/06/2018  1:45 PM [facility] [place] 2 [facility]    Dorathy Kinsman, CNM

## 2018-01-23 NOTE — Progress Notes (Signed)
Would like flu shot but has had fever in the last week; will delay to next visit.

## 2018-02-06 ENCOUNTER — Ambulatory Visit (HOSPITAL_COMMUNITY)
Admission: RE | Admit: 2018-02-06 | Discharge: 2018-02-06 | Disposition: A | Discharge status: Home / Self Care | Payer: Medicaid Other | Source: Ambulatory Visit | Attending: Internal Medicine | Admitting: Internal Medicine

## 2018-02-06 DIAGNOSIS — Z3A23 23 weeks gestation of pregnancy: Secondary | ICD-10-CM | POA: Diagnosis not present

## 2018-02-06 DIAGNOSIS — Z362 Encounter for other antenatal screening follow-up: Secondary | ICD-10-CM | POA: Diagnosis present

## 2018-02-19 ENCOUNTER — Ambulatory Visit (INDEPENDENT_AMBULATORY_CARE_PROVIDER_SITE_OTHER): Payer: Medicaid Other | Admitting: Advanced Practice Midwife

## 2018-02-19 VITALS — BP 142/71 | HR 98 | Wt 204.9 lb

## 2018-02-19 DIAGNOSIS — Z23 Encounter for immunization: Secondary | ICD-10-CM | POA: Diagnosis not present

## 2018-02-19 DIAGNOSIS — R0602 Shortness of breath: Secondary | ICD-10-CM

## 2018-02-19 DIAGNOSIS — Z3400 Encounter for supervision of normal first pregnancy, unspecified trimester: Secondary | ICD-10-CM

## 2018-02-19 DIAGNOSIS — Z3402 Encounter for supervision of normal first pregnancy, second trimester: Secondary | ICD-10-CM | POA: Diagnosis present

## 2018-02-19 NOTE — Progress Notes (Signed)
   PRENATAL VISIT NOTE  Subjective:  Joy Wood is a 22 y.o. G1P0 at 8674w0d being seen today for ongoing prenatal care.  She is currently monitored for the following issues for this low-risk pregnancy and has Menorrhagia with irregular cycle; History of prediabetes; PCOS (polycystic ovarian syndrome); Hyperlipidemia; Anemia; Onychomycosis of toenail; Supervision of normal first pregnancy, antepartum; and Rh negative status during pregnancy in second trimester on their problem list.  Patient reports panic attacks at night and feeling short of breath. Denies palpitations, chest pain, HA, vision changes, epigastric pain. Unsure if the feeling of SOB causes the feeling of panic or the other way around. Reports enormous anxiety about MIL and SIL interfering with her birth. Thinks this may be contributing to Sx.  Contractions: Not present. Vag. Bleeding: None.  Movement: Present. Denies leaking of fluid.   The following portions of the patient's history were reviewed and updated as appropriate: allergies, current medications, past family history, past medical history, past social history, past surgical history and problem list. Problem list updated.  Objective:   Vitals:   02/19/18 1533  BP: (!) 142/71  Pulse: 98  Weight: 204 lb 14.4 oz (92.9 kg)    Fetal Status: Fetal Heart Rate (bpm): 153   Movement: Present     General:  Alert, oriented and cooperative. Patient is in no acute distress.  Skin: Skin is warm and dry. No rash noted.   Cardiovascular: Normal heart rate noted  Respiratory: Normal respiratory effort, no problems with respiration noted  Abdomen: Soft, gravid, appropriate for gestational age.  Pain/Pressure: Present     Pelvic: Cervical exam deferred        Extremities: Normal range of motion.  Edema: None  Mental Status: Normal mood and affect. Normal behavior. Normal judgment and thought content.   Assessment and Plan:  Pregnancy: G1P0 at 2974w0d  1. Supervision of normal  first pregnancy, antepartum  - Flu Vaccine QUAD 36+ mos IM  2. Transient HTN w/out evidence of Pre-E - Pre-E labs - Pre-E precautions   3. Panic attacks  - Offered IBH, declined. - Discussed measures for limiting visitors in the hospital   Preterm labor symptoms and general obstetric precautions including but not limited to vaginal bleeding, contractions, leaking of fluid and fetal movement were reviewed in detail with the patient. Please refer to After Visit Summary for other counseling recommendations.  Return in about 4 weeks (around 03/19/2018) for ROB/GTT.  No future appointments.  Dorathy KinsmanVirginia Halen Antenucci, CNM

## 2018-02-19 NOTE — Patient Instructions (Signed)
TDaP Vaccine Pregnancy Get the Whooping Cough Vaccine While You Are Pregnant (CDC)  It is important for women to get the whooping cough vaccine in the third trimester of each pregnancy. Vaccines are the best way to prevent this disease. There are 2 different whooping cough vaccines. Both vaccines combine protection against whooping cough, tetanus and diphtheria, but they are for different age groups: Tdap: for everyone 11 years or older, including pregnant women  DTaP: for children 2 months through 6 years of age  You need the whooping cough vaccine during each of your pregnancies The recommended time to get the shot is during your 27th through 36th week of pregnancy, preferably during the earlier part of this time period. The Centers for Disease Control and Prevention (CDC) recommends that pregnant women receive the whooping cough vaccine for adolescents and adults (called Tdap vaccine) during the third trimester of each pregnancy. The recommended time to get the shot is during your 27th through 36th week of pregnancy, preferably during the earlier part of this time period. This replaces the original recommendation that pregnant women get the vaccine only if they had not previously received it. The American College of Obstetricians and Gynecologists and the American College of Nurse-Midwives support this recommendation.  You should get the whooping cough vaccine while pregnant to pass protection to your baby frame support disabled and/or not supported in this browser  Learn why Laura decided to get the whooping cough vaccine in her 3rd trimester of pregnancy and how her baby girl was born with some protection against the disease. Also available on YouTube. After receiving the whooping cough vaccine, your body will create protective antibodies (proteins produced by the body to fight off diseases) and pass some of them to your baby before birth. These antibodies provide your baby some short-term  protection against whooping cough in early life. These antibodies can also protect your baby from some of the more serious complications that come along with whooping cough. Your protective antibodies are at their highest about 2 weeks after getting the vaccine, but it takes time to pass them to your baby. So the preferred time to get the whooping cough vaccine is early in your third trimester. The amount of whooping cough antibodies in your body decreases over time. That is why CDC recommends you get a whooping cough vaccine during each pregnancy. Doing so allows each of your babies to get the greatest number of protective antibodies from you. This means each of your babies will get the best protection possible against this disease.  Getting the whooping cough vaccine while pregnant is better than getting the vaccine after you give birth Whooping cough vaccination during pregnancy is ideal so your baby will have short-term protection as soon as he is born. This early protection is important because your baby will not start getting his whooping cough vaccines until he is 2 months old. These first few months of life are when your baby is at greatest risk for catching whooping cough. This is also when he's at greatest risk for having severe, potentially life-threating complications from the infection. To avoid that gap in protection, it is best to get a whooping cough vaccine during pregnancy. You will then pass protection to your baby before he is born. To continue protecting your baby, he should get whooping cough vaccines starting at 2 months old. You may never have gotten the Tdap vaccine before and did not get it during this pregnancy. If so, you should make sure   to get the vaccine immediately after you give birth, before leaving the hospital or birthing center. It will take about 2 weeks before your body develops protection (antibodies) in response to the vaccine. Once you have protection from the vaccine,  you are less likely to give whooping cough to your newborn while caring for him. But remember, your baby will still be at risk for catching whooping cough from others. A recent study looked to see how effective Tdap was at preventing whooping cough in babies whose mothers got the vaccine while pregnant or in the hospital after giving birth. The study found that getting Tdap between 27 through 36 weeks of pregnancy is 85% more effective at preventing whooping cough in babies younger than 2 months old. Blood tests cannot tell if you need a whooping cough vaccine There are no blood tests that can tell you if you have enough antibodies in your body to protect yourself or your baby against whooping cough. Even if you have been sick with whooping cough in the past or previously received the vaccine, you still should get the vaccine during each pregnancy. Breastfeeding may pass some protective antibodies onto your baby By breastfeeding, you may pass some antibodies you have made in response to the vaccine to your baby. When you get a whooping cough vaccine during your pregnancy, you will have antibodies in your breast milk that you can share with your baby as soon as your milk comes in. However, your baby will not get protective antibodies immediately if you wait to get the whooping cough vaccine until after delivering your baby. This is because it takes about 2 weeks for your body to create antibodies. Learn more about the health benefits of breastfeeding.   Glucose Tolerance Test During Pregnancy The glucose tolerance test (GTT) is a blood test used to determine if you have developed a type of diabetes during pregnancy (gestational diabetes). This is when your body does not properly process sugar (glucose) in the food you eat, resulting in high blood glucose levels. Typically, a GTT is done after you have had a 1-hour glucose test with results that indicate you possibly have gestational diabetes. It may also be  done if:  You have a history of giving birth to very large babies or have experienced repeated fetal loss (stillbirth).  You have signs and symptoms of diabetes, such as: ? Changes in your vision. ? Tingling or numbness in your hands or feet. ? Changes in hunger, thirst, and urination not otherwise explained by your pregnancy.  The GTT lasts about 3 hours. You will be given a sugar-water solution to drink at the beginning of the test. You will have blood drawn before you drink the solution and then again 1, 2, and 3 hours after you drink it. You will not be allowed to eat or drink anything else during the test. You must remain at the testing location to make sure that your blood is drawn on time. You should also avoid exercising during the test, because exercise can alter test results. How do I prepare for this test? Eat normally for 3 days prior to the GTT test, including having plenty of carbohydrate-rich foods. Do not eat or drink anything except water during the final 12 hours before the test. In addition, your health care provider may ask you to stop taking certain medicines before the test. What do the results mean? It is your responsibility to obtain your test results. Ask the lab or department performing the  test when and how you will get your results. Contact your health care provider to discuss any questions you have about your results. Range of Normal Values Ranges for normal values may vary among different labs and hospitals. You should always check with your health care provider after having lab work or other tests done to discuss whether your values are considered within normal limits. Normal levels of blood glucose are as follows:  Fasting: less than 105 mg/dL.  1 hour after drinking the solution: less than 190 mg/dL.  2 hours after drinking the solution: less than 165 mg/dL.  3 hours after drinking the solution: less than 145 mg/dL.  Some substances can interfere with GTT  results. These may include:  Blood pressure and heart failure medicines, including beta blockers, furosemide, and thiazides.  Anti-inflammatory medicines, including aspirin.  Nicotine.  Some psychiatric medicines.  Meaning of Results Outside Normal Value Ranges GTT test results that are above normal values may indicate a number of health problems, such as:  Gestational diabetes.  Acute stress response.  Cushing syndrome.  Tumors such as pheochromocytoma or glucagonoma.  Long-term kidney problems.  Pancreatitis.  Hyperthyroidism.  Current infection.  Discuss your test results with your health care provider. He or she will use the results to make a diagnosis and determine a treatment plan that is right for you. This information is not intended to replace advice given to you by your health care provider. Make sure you discuss any questions you have with your health care provider. Document Released: 09/19/2011 Document Revised: 08/26/2015 Document Reviewed: 07/25/2013 Elsevier Interactive Patient Education  [employer].

## 2018-02-19 NOTE — Progress Notes (Signed)
Medicaid Home Form completed. 

## 2018-02-20 LAB — CBC
HEMOGLOBIN: 11.4 g/dL (ref 11.1–15.9)
Hematocrit: 33.9 % — ABNORMAL LOW (ref 34.0–46.6)
MCH: 27.6 pg (ref 26.6–33.0)
MCHC: 33.6 g/dL (ref 31.5–35.7)
MCV: 82 fL (ref 79–97)
PLATELETS: 325 10*3/uL (ref 150–450)
RBC: 4.13 x10E6/uL (ref 3.77–5.28)
RDW: 14 % (ref 12.3–15.4)
WBC: 11.8 10*3/uL — ABNORMAL HIGH (ref 3.4–10.8)

## 2018-03-21 ENCOUNTER — Other Ambulatory Visit: Payer: Self-pay | Admitting: *Deleted

## 2018-03-21 DIAGNOSIS — O26892 Other specified pregnancy related conditions, second trimester: Secondary | ICD-10-CM

## 2018-03-21 DIAGNOSIS — Z6791 Unspecified blood type, Rh negative: Secondary | ICD-10-CM

## 2018-03-21 DIAGNOSIS — Z3400 Encounter for supervision of normal first pregnancy, unspecified trimester: Secondary | ICD-10-CM

## 2018-03-22 ENCOUNTER — Other Ambulatory Visit: Payer: Medicaid Other

## 2018-03-22 ENCOUNTER — Ambulatory Visit (INDEPENDENT_AMBULATORY_CARE_PROVIDER_SITE_OTHER): Payer: Medicaid Other | Admitting: Student

## 2018-03-22 ENCOUNTER — Encounter: Payer: Self-pay | Admitting: Student

## 2018-03-22 VITALS — BP 121/69 | HR 95 | Wt 216.5 lb

## 2018-03-22 DIAGNOSIS — Z3400 Encounter for supervision of normal first pregnancy, unspecified trimester: Secondary | ICD-10-CM

## 2018-03-22 DIAGNOSIS — O26893 Other specified pregnancy related conditions, third trimester: Secondary | ICD-10-CM

## 2018-03-22 DIAGNOSIS — O360930 Maternal care for other rhesus isoimmunization, third trimester, not applicable or unspecified: Secondary | ICD-10-CM | POA: Diagnosis present

## 2018-03-22 DIAGNOSIS — O26892 Other specified pregnancy related conditions, second trimester: Secondary | ICD-10-CM

## 2018-03-22 DIAGNOSIS — O26899 Other specified pregnancy related conditions, unspecified trimester: Secondary | ICD-10-CM | POA: Insufficient documentation

## 2018-03-22 DIAGNOSIS — Z6791 Unspecified blood type, Rh negative: Secondary | ICD-10-CM | POA: Insufficient documentation

## 2018-03-22 DIAGNOSIS — Z23 Encounter for immunization: Secondary | ICD-10-CM | POA: Diagnosis not present

## 2018-03-22 DIAGNOSIS — R030 Elevated blood-pressure reading, without diagnosis of hypertension: Secondary | ICD-10-CM

## 2018-03-22 DIAGNOSIS — Z3403 Encounter for supervision of normal first pregnancy, third trimester: Secondary | ICD-10-CM

## 2018-03-22 MED ORDER — RHO D IMMUNE GLOBULIN 1500 UNIT/2ML IJ SOSY
300.0000 ug | PREFILLED_SYRINGE | Freq: Once | INTRAMUSCULAR | Status: AC
  Administered 2018-03-22: 300 ug via INTRAMUSCULAR

## 2018-03-22 NOTE — Addendum Note (Signed)
Addended by: Gerrit HeckEMLY, Delcenia Inman L on: 03/22/2018 11:57 AM   Modules accepted: Orders

## 2018-03-22 NOTE — Progress Notes (Signed)
   PRENATAL VISIT NOTE  Subjective:  Joy Wood is a 22 y.o. G1P0 at 50w3dbeing seen today for ongoing prenatal care.  She is currently monitored for the following issues for this low-risk pregnancy and has History of prediabetes; PCOS (polycystic ovarian syndrome); Hyperlipidemia; Anemia; Supervision of normal first pregnancy, antepartum; Rh negative state in antepartum period; and Elevated BP without diagnosis of hypertension on their problem list.  Patient reports carpal tunnel symptoms.  Contractions: Not present. Vag. Bleeding: None.  Movement: Present. Denies leaking of fluid.   The following portions of the patient's history were reviewed and updated as appropriate: allergies, current medications, past family history, past medical history, past social history, past surgical history and problem list. Problem list updated.  Objective:   Vitals:   03/22/18 0919  BP: 121/69  Pulse: 95  Weight: 216 lb 8 oz (98.2 kg)    Fetal Status: Fetal Heart Rate (bpm): 152 Fundal Height: 30 cm Movement: Present     General:  Alert, oriented and cooperative. Patient is in no acute distress.  Skin: Skin is warm and dry. No rash noted.   Cardiovascular: Normal heart rate noted  Respiratory: Normal respiratory effort, no problems with respiration noted  Abdomen: Soft, gravid, appropriate for gestational age.  Pain/Pressure: Present     Pelvic: Cervical exam deferred        Extremities: Normal range of motion.  Edema: None  Mental Status: Normal mood and affect. Normal behavior. Normal judgment and thought content.   Assessment and Plan:  Pregnancy: G1P0 at 270w3d1. Supervision of normal first pregnancy, antepartum -Discussed relief methods for c/o numbness in hands and fingers. -Discussed 28 week labs including Rhogam shot and need for additional labs s/t Elevated BP on 11/19. -Educated on healthy diet choices and weight gain (12lbs since last visit).  Encouraged moderation in consumption  of hight fat foods. -Discussed contraception methods; patient desires Joy Wood after delivery.  2. Elevated BP without diagnosis of hypertension -Informed of elevation in BP (142/71) at previous visit and need for f/u testing. - Comp Met (CMET) - Protein / creatinine ratio, urine  3. Rh negative state in antepartum period -Discussed Rh negative status and POC to include; --Rhogam shot today --Potential need for repeat [place] status  Preterm labor symptoms and general obstetric precautions including but not limited to vaginal bleeding, contractions, leaking of fluid and fetal movement were reviewed in detail with the patient. Please refer to After Visit Summary for other counseling recommendations.  Return in about 2 weeks (around 04/05/2018) for ROB.  No future appointments.  JeMaryann ConnersCNM

## 2018-03-22 NOTE — Addendum Note (Signed)
Addended by: Gwendlyn DeutscherJIMERSON, COURTNEY A on: 03/22/2018 11:59 AM   Modules accepted: Orders

## 2018-03-23 LAB — COMPREHENSIVE METABOLIC PANEL
A/G RATIO: 1.2 (ref 1.2–2.2)
ALT: 17 IU/L (ref 0–32)
AST: 13 IU/L (ref 0–40)
Albumin: 3.5 g/dL (ref 3.5–5.5)
Alkaline Phosphatase: 77 IU/L (ref 39–117)
BUN/Creatinine Ratio: 13 (ref 9–23)
BUN: 7 mg/dL (ref 6–20)
Bilirubin Total: <0.2 mg/dL (ref 0.0–1.2)
CALCIUM: 9.2 mg/dL (ref 8.7–10.2)
CHLORIDE: 102 mmol/L (ref 96–106)
CO2: 20 mmol/L (ref 20–29)
Creatinine, Ser: 0.53 mg/dL — ABNORMAL LOW (ref 0.57–1.00)
GFR, EST AFRICAN AMERICAN: 155 mL/min/{1.73_m2} (ref >59)
GFR, EST NON AFRICAN AMERICAN: 134 mL/min/{1.73_m2} (ref >59)
Globulin, Total: 3 g/dL (ref 1.5–4.5)
Glucose: 135 mg/dL — ABNORMAL HIGH (ref 65–99)
POTASSIUM: 3.8 mmol/L (ref 3.5–5.2)
SODIUM: 137 mmol/L (ref 134–144)
Total Protein: 6.5 g/dL (ref 6.0–8.5)

## 2018-03-23 LAB — CBC
Hematocrit: 32.1 % — ABNORMAL LOW (ref 34.0–46.6)
Hemoglobin: 10.6 g/dL — ABNORMAL LOW (ref 11.1–15.9)
MCH: 27.1 pg (ref 26.6–33.0)
MCHC: 33 g/dL (ref 31.5–35.7)
MCV: 82 fL (ref 79–97)
Platelets: 298 10*3/uL (ref 150–450)
RBC: 3.91 x10E6/uL (ref 3.77–5.28)
RDW: 14.4 % (ref 12.3–15.4)
WBC: 10.8 10*3/uL (ref 3.4–10.8)

## 2018-03-23 LAB — PROTEIN / CREATININE RATIO, URINE
Creatinine, Urine: 87.6 mg/dL
PROTEIN UR: 11.5 mg/dL
Protein/Creat Ratio: 131 mg/g creat (ref 0–200)

## 2018-03-23 LAB — HIV ANTIBODY (ROUTINE TESTING W REFLEX): HIV SCREEN 4TH GENERATION: NONREACTIVE

## 2018-03-23 LAB — GLUCOSE TOLERANCE, 2 HOURS W/ 1HR
GLUCOSE, FASTING: 97 mg/dL — AB (ref 65–91)
Glucose, 1 hour: 190 mg/dL — ABNORMAL HIGH (ref 65–179)
Glucose, 2 hour: 145 mg/dL (ref 65–152)

## 2018-03-23 LAB — ANTIBODY SCREEN: Antibody Screen: NEGATIVE

## 2018-03-23 LAB — RPR: RPR Ser Ql: NONREACTIVE

## 2018-03-26 ENCOUNTER — Encounter: Payer: Self-pay | Admitting: Student

## 2018-03-26 DIAGNOSIS — Z8632 Personal history of gestational diabetes: Secondary | ICD-10-CM | POA: Insufficient documentation

## 2018-04-03 NOTE — L&D Delivery Note (Signed)
Patient: Joy Wood MRN: [id number]  GBS status: Negative, IAP given: None   Patient is a 23 y.o. now G1P1 s/p NSVD at [redacted]w[redacted]d, who was admitted for IOL for A2GDM. Labor course complicated by Triple I, treated with Ampicillin/Gentamycin/Tylenol. AROM 10h 4m prior to delivery with heavy meconium stained fluid.    Delivery Note At 11:49 AM a viable female was delivered via Vaginal, Spontaneous (Presentation: OA).  APGAR: 8, 9; weight pending.   Placenta status: spontaneous, intact.  Cord: 3 vessel with the following complications: none.   Anesthesia:  Epidural  Episiotomy: None Lacerations: 2nd degree Suture Repair: 3.0 vicryl rapide Est. Blood Loss (mL): 445  Head delivered OA. No nuchal cord present. Shoulder and body delivered in usual fashion. Terminal meconium and meconium stained fluid noted at delivery. Infant without spontaneous cry and poor tone. Cord clamped x 2 after 20-second delay, and cut by provider. Infant taken to the warmer and delivery call noted. During transfer to warmer, infant with spontaneous cry. Cord blood drawn. Placenta delivered spontaneously with gentle cord traction. Sent to pathology due to Triple I. Fundus firm with massage and Pitocin. Perineum inspected and found to have 2nd degree laceration, which was repaired with 3.0 vicryl rapide with good hemostasis achieved.  Mom to postpartum.  Baby to Couplet care / Skin to Skin.  De Hollingshead 05/29/2018, 12:31 PM

## 2018-04-09 ENCOUNTER — Encounter: Payer: Medicaid Other | Admitting: Student

## 2018-04-09 ENCOUNTER — Encounter: Payer: Medicaid Other | Attending: Obstetrics & Gynecology | Admitting: *Deleted

## 2018-04-09 ENCOUNTER — Ambulatory Visit: Payer: Medicaid Other | Admitting: *Deleted

## 2018-04-09 DIAGNOSIS — Z3A00 Weeks of gestation of pregnancy not specified: Secondary | ICD-10-CM | POA: Insufficient documentation

## 2018-04-09 DIAGNOSIS — Z713 Dietary counseling and surveillance: Secondary | ICD-10-CM | POA: Insufficient documentation

## 2018-04-09 DIAGNOSIS — O24415 Gestational diabetes mellitus in pregnancy, controlled by oral hypoglycemic drugs: Secondary | ICD-10-CM | POA: Diagnosis present

## 2018-04-09 MED ORDER — ACCU-CHEK GUIDE W/DEVICE KIT: 1.0000 | PACK | Freq: Once | 0 refills | Status: AC

## 2018-04-09 MED ORDER — ACCU-CHEK FASTCLIX LANCETS MISC: 1.0000 | Freq: Four times a day (QID) | 12 refills | Status: DC

## 2018-04-09 MED ORDER — GLUCOSE BLOOD VI STRP: ORAL_STRIP | 12 refills | Status: DC

## 2018-04-09 NOTE — Progress Notes (Signed)
  Patient was seen on 04/09/2018 for Gestational Diabetes self-management. EDD 06/04/2018. Patient states no history of GDM but her father who lives in [place] has diabetes and she states she as PCOS . Diet history obtained. Patient eats good variety of all food groups. Beverages include mostly water with occasional regular soda when eats in restaurant.  The following learning objectives were met by the patient :   States the definition of Gestational Diabetes  States why dietary management is important in controlling blood glucose  Describes the effects of carbohydrates on blood glucose levels  Demonstrates ability to create a balanced meal plan  Demonstrates carbohydrate counting   States when to check blood glucose levels  Demonstrates proper blood glucose monitoring techniques  States the effect of stress and exercise on blood glucose levels  States the importance of limiting caffeine and abstaining from alcohol and smoking  Plan:  Aim for 3 Carb Choices per meal (45 grams) +/- 1 either way  Aim for 1-2 Carbs per snack Begin reading food labels for Total Carbohydrate of foods If OK with your MD, consider  increasing your activity level by walking, Arm Chair Exercises or other activity daily as tolerated Begin checking BG before breakfast and 2 hours after first bite of breakfast, lunch and dinner as directed by MD  Bring Log Book/Sheet to every medical appointment OR Baby Scripts:  Patient was introduced to [employer] and plans to use as record of BG electronically  Take medication if directed by MD  Blood glucose monitor Rx called into pharmacy: Accu Check Guide with Fast Clix drums Patient instructed to test pre breakfast and 2 hours each meal as directed by MD  She was upset when she realized she will need to check her BG several times a day, but was better by the end of the visit.  Patient instructed to monitor glucose levels: FBS: 60 - 95 mg/dl 2 hour: <120  mg/dl  Patient received the following handouts:  Nutrition Diabetes and Pregnancy  Carbohydrate Counting List  BG Log Sheet  Patient will be seen for follow-up as needed.

## 2018-04-16 ENCOUNTER — Ambulatory Visit (INDEPENDENT_AMBULATORY_CARE_PROVIDER_SITE_OTHER): Payer: Medicaid Other | Admitting: Internal Medicine

## 2018-04-16 VITALS — BP 130/79 | HR 112 | Wt 221.0 lb

## 2018-04-16 DIAGNOSIS — O24415 Gestational diabetes mellitus in pregnancy, controlled by oral hypoglycemic drugs: Secondary | ICD-10-CM

## 2018-04-16 DIAGNOSIS — Z3403 Encounter for supervision of normal first pregnancy, third trimester: Secondary | ICD-10-CM

## 2018-04-16 DIAGNOSIS — R030 Elevated blood-pressure reading, without diagnosis of hypertension: Secondary | ICD-10-CM

## 2018-04-16 DIAGNOSIS — Z3400 Encounter for supervision of normal first pregnancy, unspecified trimester: Secondary | ICD-10-CM

## 2018-04-16 MED ORDER — METFORMIN HCL ER 500 MG PO TB24: 500.0000 mg | ORAL_TABLET | Freq: Two times a day (BID) | ORAL | 1 refills | Status: DC

## 2018-04-16 NOTE — Progress Notes (Signed)
   PRENATAL VISIT NOTE  Subjective:  Joy Wood is a 23 y.o. G1P0 at [redacted]w[redacted]d being seen today for ongoing prenatal care.  She is currently monitored for the following issues for this high-risk pregnancy and has History of prediabetes; PCOS (polycystic ovarian syndrome); Hyperlipidemia; Anemia; Supervision of normal first pregnancy, antepartum; Rh negative state in antepartum period; Elevated BP without diagnosis of hypertension; and Gestational diabetes mellitus (GDM) in third trimester on their problem list.  Patient reports no complaints.  Contractions: Irritability. Vag. Bleeding: None.  Movement: Present. Denies leaking of fluid.   The following portions of the patient's history were reviewed and updated as appropriate: allergies, current medications, past family history, past medical history, past social history, past surgical history and problem list. Problem list updated.  Objective:   Vitals:   04/16/18 1508  BP: 130/79  Pulse: (!) 112  Weight: 221 lb (100.2 kg)    Fetal Status: Fetal Heart Rate (bpm): 138 Fundal Height: 35 cm Movement: Present     General:  Alert, oriented and cooperative. Patient is in no acute distress.  Skin: Skin is warm and dry. No rash noted.   Cardiovascular: Normal heart rate noted  Respiratory: Normal respiratory effort, no problems with respiration noted  Abdomen: Soft, gravid, appropriate for gestational age.  Pain/Pressure: Present     Pelvic: Cervical exam deferred        Extremities: Normal range of motion.  Edema: None  Mental Status: Normal mood and affect. Normal behavior. Normal judgment and thought content.   Assessment and Plan:  Pregnancy: G1P0 at [redacted]w[redacted]d  1. Supervision of normal first pregnancy, antepartum Continue routine PNC.   2. Gestational diabetes mellitus (GDM) in third trimester controlled on oral hypoglycemic drug Reviewed CBGs. Patient inconsistent with recording CBGs but available fasting CBGs above goal of 90 and  postprandial CBGs above goal of 120.  - metFORMIN (GLUCOPHAGE-XR) 500 MG 24 hr tablet; Take 1 tablet (500 mg total) by mouth 2 (two) times daily.  Dispense: 60 tablet; Refill: 1 - [place] MFM OB FOLLOW UP; Future -start weekly BPP and NST monitoring  -counseled on dietary modifications  -encouraged more consistent measurement of CBGs   3. Elevated BP without diagnosis of hypertension BP 130/79 today. Patient asymptomatic. PEC labs within normal limits. Continue to monitor.    Preterm labor symptoms and general obstetric precautions including but not limited to vaginal bleeding, contractions, leaking of fluid and fetal movement were reviewed in detail with the patient. Please refer to After Visit Summary for other counseling recommendations.  Return in about 2 weeks (around 04/30/2018) for high risk OB.  No future appointments.  De Hollingshead, DO

## 2018-04-16 NOTE — Patient Instructions (Addendum)
Our goal is for your fasting blood sugars to be less than 90 and your post-meal blood sugars to be less than 120. Your blood sugars are higher than these numbers so I have prescribed Metformin to take twice per day to control blood sugar. The medication can cause GI side effects the first few days that you take it, but typically resolve. Please call if you have diarrhea that does not subside.    Third Trimester of Pregnancy The third trimester is from week 28 through week 40 (months 7 through 9). The third trimester is a time when the unborn baby (fetus) is growing rapidly. At the end of the ninth month, the fetus is about 20 inches in length and weighs 6-10 pounds. Body changes during your third trimester Your body will continue to go through many changes during pregnancy. The changes vary from woman to woman. During the third trimester:  Your weight will continue to increase. You can expect to gain 25-35 pounds (11-16 kg) by the end of the pregnancy.  You may begin to get stretch marks on your hips, abdomen, and breasts.  You may urinate more often because the fetus is moving lower into your pelvis and pressing on your bladder.  You may develop or continue to have heartburn. This is caused by increased hormones that slow down muscles in the digestive tract.  You may develop or continue to have constipation because increased hormones slow digestion and cause the muscles that push waste through your intestines to relax.  You may develop hemorrhoids. These are swollen veins (varicose veins) in the rectum that can itch or be painful.  You may develop swollen, bulging veins (varicose veins) in your legs.  You may have increased body aches in the pelvis, back, or thighs. This is due to weight gain and increased hormones that are relaxing your joints.  You may have changes in your hair. These can include thickening of your hair, rapid growth, and changes in texture. Some women also have hair loss  during or after pregnancy, or hair that feels dry or thin. Your hair will most likely return to normal after your baby is born.  Your breasts will continue to grow and they will continue to become tender. A yellow fluid (colostrum) may leak from your breasts. This is the first milk you are producing for your baby.  Your belly button may stick out.  You may notice more swelling in your hands, face, or ankles.  You may have increased tingling or numbness in your hands, arms, and legs. The skin on your belly may also feel numb.  You may feel short of breath because of your expanding uterus.  You may have more problems sleeping. This can be caused by the size of your belly, increased need to urinate, and an increase in your body's metabolism.  You may notice the fetus "dropping," or moving lower in your abdomen (lightening).  You may have increased vaginal discharge.  You may notice your joints feel loose and you may have pain around your pelvic bone. What to expect at prenatal visits You will have prenatal exams every 2 weeks until week 36. Then you will have weekly prenatal exams. During a routine prenatal visit:  You will be weighed to make sure you and the baby are growing normally.  Your blood pressure will be taken.  Your abdomen will be measured to track your baby's growth.  The fetal heartbeat will be listened to.  Any test results from  the previous visit will be discussed.  You may have a cervical check near your due date to see if your cervix has softened or thinned (effaced).  You will be tested for Group B streptococcus. This happens between 35 and 37 weeks. Your health care provider may ask you:  What your birth plan is.  How you are feeling.  If you are feeling the baby move.  If you have had any abnormal symptoms, such as leaking fluid, bleeding, severe headaches, or abdominal cramping.  If you are using any tobacco products, including cigarettes, chewing  tobacco, and electronic cigarettes.  If you have any questions. Other tests or screenings that may be performed during your third trimester include:  Blood tests that check for low iron levels (anemia).  Fetal testing to check the health, activity level, and growth of the fetus. Testing is done if you have certain medical conditions or if there are problems during the pregnancy.  Nonstress test (NST). This test checks the health of your baby to make sure there are no signs of problems, such as the baby not getting enough oxygen. During this test, a belt is placed around your belly. The baby is made to move, and its heart rate is monitored during movement. What is false labor? False labor is a condition in which you feel small, irregular tightenings of the muscles in the womb (contractions) that usually go away with rest, changing position, or drinking water. These are called Braxton Hicks contractions. Contractions may last for hours, days, or even weeks before true labor sets in. If contractions come at regular intervals, become more frequent, increase in intensity, or become painful, you should see your health care provider. What are the signs of labor?  Abdominal cramps.  Regular contractions that start at 10 minutes apart and become stronger and more frequent with time.  Contractions that start on the top of the uterus and spread down to the lower abdomen and back.  Increased pelvic pressure and dull back pain.  A watery or bloody mucus discharge that comes from the vagina.  Leaking of amniotic fluid. This is also known as your "water breaking." It could be a slow trickle or a gush. Let your health care provider know if it has a color or strange odor. If you have any of these signs, call your health care provider right away, even if it is before your due date. Follow these instructions at home: Medicines  Follow your health care provider's instructions regarding medicine use.  Specific medicines may be either safe or unsafe to take during pregnancy.  Take a prenatal vitamin that contains at least 600 micrograms (mcg) of folic acid.  If you develop constipation, try taking a stool softener if your health care provider approves. Eating and drinking   Eat a balanced diet that includes fresh fruits and vegetables, whole grains, good sources of protein such as meat, eggs, or tofu, and low-fat dairy. Your health care provider will help you determine the amount of weight gain that is right for you.  Avoid raw meat and uncooked cheese. These carry germs that can cause birth defects in the baby.  If you have low calcium intake from food, talk to your health care provider about whether you should take a daily calcium supplement.  Eat four or five small meals rather than three large meals a day.  Limit foods that are high in fat and processed sugars, such as fried and sweet foods.  To prevent constipation: ?  Drink enough fluid to keep your urine clear or pale yellow. ? Eat foods that are high in fiber, such as fresh fruits and vegetables, whole grains, and beans. Activity  Exercise only as directed by your health care provider. Most women can continue their usual exercise routine during pregnancy. Try to exercise for 30 minutes at least 5 days a week. Stop exercising if you experience uterine contractions.  Avoid heavy lifting.  Do not exercise in extreme heat or humidity, or at high altitudes.  Wear low-heel, comfortable shoes.  Practice good posture.  You may continue to have sex unless your health care provider tells you otherwise. Relieving pain and discomfort  Take frequent breaks and rest with your legs elevated if you have leg cramps or low back pain.  Take warm sitz baths to soothe any pain or discomfort caused by hemorrhoids. Use hemorrhoid cream if your health care provider approves.  Wear a good support bra to prevent discomfort from breast  tenderness.  If you develop varicose veins: ? Wear support pantyhose or compression stockings as told by your healthcare provider. ? Elevate your feet for 15 minutes, 3-4 times a day. Prenatal care  Write down your questions. Take them to your prenatal visits.  Keep all your prenatal visits as told by your health care provider. This is important. Safety  Wear your seat belt at all times when driving.  Make a list of emergency phone numbers, including numbers for family, friends, the hospital, and police and fire departments. General instructions  Avoid cat litter boxes and soil used by cats. These carry germs that can cause birth defects in the baby. If you have a cat, ask someone to clean the litter box for you.  Do not travel far distances unless it is absolutely necessary and only with the approval of your health care provider.  Do not use hot tubs, steam rooms, or saunas.  Do not drink alcohol.  Do not use any products that contain nicotine or tobacco, such as cigarettes and e-cigarettes. If you need help quitting, ask your health care provider.  Do not use any medicinal herbs or unprescribed drugs. These chemicals affect the formation and growth of the baby.  Do not douche or use tampons or scented sanitary pads.  Do not cross your legs for long periods of time.  To prepare for the arrival of your baby: ? Take prenatal classes to understand, practice, and ask questions about labor and delivery. ? Make a trial run to the hospital. ? Visit the hospital and tour the maternity area. ? Arrange for maternity or paternity leave through employers. ? Arrange for family and friends to take care of pets while you are in the hospital. ? Purchase a rear-facing car seat and make sure you know how to install it in your car. ? Pack your hospital bag. ? Prepare the baby's nursery. Make sure to remove all pillows and stuffed animals from the baby's crib to prevent suffocation.  Visit  your dentist if you have not gone during your pregnancy. Use a soft toothbrush to brush your teeth and be gentle when you floss. Contact a health care provider if:  You are unsure if you are in labor or if your water has broken.  You become dizzy.  You have mild pelvic cramps, pelvic pressure, or nagging pain in your abdominal area.  You have lower back pain.  You have persistent nausea, vomiting, or diarrhea.  You have an unusual or bad smelling vaginal  discharge.  You have pain when you urinate. Get help right away if:  Your water breaks before 37 weeks.  You have regular contractions less than 5 minutes apart before 37 weeks.  You have a fever.  You are leaking fluid from your vagina.  You have spotting or bleeding from your vagina.  You have severe abdominal pain or cramping.  You have rapid weight loss or weight gain.  You have shortness of breath with chest pain.  You notice sudden or extreme swelling of your face, hands, ankles, feet, or legs.  Your baby makes fewer than 10 movements in 2 hours.  You have severe headaches that do not go away when you take medicine.  You have vision changes. Summary  The third trimester is from week 28 through week 40, months 7 through 9. The third trimester is a time when the unborn baby (fetus) is growing rapidly.  During the third trimester, your discomfort may increase as you and your baby continue to gain weight. You may have abdominal, leg, and back pain, sleeping problems, and an increased need to urinate.  During the third trimester your breasts will keep growing and they will continue to become tender. A yellow fluid (colostrum) may leak from your breasts. This is the first milk you are producing for your baby.  False labor is a condition in which you feel small, irregular tightenings of the muscles in the womb (contractions) that eventually go away. These are called Braxton Hicks contractions. Contractions may last for  hours, days, or even weeks before true labor sets in.  Signs of labor can include: abdominal cramps; regular contractions that start at 10 minutes apart and become stronger and more frequent with time; watery or bloody mucus discharge that comes from the vagina; increased pelvic pressure and dull back pain; and leaking of amniotic fluid. This information is not intended to replace advice given to you by your health care provider. Make sure you discuss any questions you have with your health care provider. Document Released: 03/14/2001 Document Revised: 04/25/2016 Document Reviewed: 04/25/2016 Elsevier Interactive Patient Education  2019 [employer]

## 2018-04-18 ENCOUNTER — Inpatient Hospital Stay (HOSPITAL_COMMUNITY)
Admission: AD | Admit: 2018-04-18 | Discharge: 2018-04-18 | Disposition: A | Discharge status: Home / Self Care | Payer: Medicaid Other | Attending: Obstetrics & Gynecology | Admitting: Obstetrics & Gynecology

## 2018-04-18 ENCOUNTER — Other Ambulatory Visit: Payer: Self-pay | Admitting: Internal Medicine

## 2018-04-18 ENCOUNTER — Ambulatory Visit (HOSPITAL_COMMUNITY)
Admission: RE | Admit: 2018-04-18 | Discharge: 2018-04-18 | Disposition: A | Discharge status: Home / Self Care | Payer: Medicaid Other | Source: Ambulatory Visit | Attending: Internal Medicine | Admitting: Internal Medicine

## 2018-04-18 ENCOUNTER — Encounter: Payer: Self-pay | Admitting: General Practice

## 2018-04-18 ENCOUNTER — Encounter (HOSPITAL_COMMUNITY): Payer: Self-pay

## 2018-04-18 ENCOUNTER — Other Ambulatory Visit: Payer: Self-pay

## 2018-04-18 DIAGNOSIS — O24415 Gestational diabetes mellitus in pregnancy, controlled by oral hypoglycemic drugs: Secondary | ICD-10-CM

## 2018-04-18 DIAGNOSIS — Z3A33 33 weeks gestation of pregnancy: Secondary | ICD-10-CM | POA: Diagnosis not present

## 2018-04-18 DIAGNOSIS — O360130 Maternal care for anti-D [Rh] antibodies, third trimester, not applicable or unspecified: Secondary | ICD-10-CM

## 2018-04-18 HISTORY — DX: Anxiety disorder, unspecified: F41.9

## 2018-04-18 HISTORY — DX: Type 2 diabetes mellitus without complications: E11.9

## 2018-04-18 NOTE — Progress Notes (Unsigned)
Patient triggered in [employer] for elevated blood sugars. Per chart review, patient saw Dr Earlene Plater on 1/14 and was started on metformin. Patient has follow up appt on 1/22. Will route to Dr Earlene Plater.

## 2018-04-18 NOTE — [facility] Provider Note (Signed)
History Referred in for BPP 6/8 in MFM today. In testing for GDM A2. CBGs have been poorly controlled. Recently started on meds  Physical exam Vitals:   04/18/18 1729  BP: 122/67  Pulse: 84  Resp: 16  Temp: 98.3 F (36.8 C)  SpO2: 100%   Physical Examination: General appearance - alert, well appearing, and in no distress Neck - supple, no significant adenopathy Chest - normal effort Heart - normal rate and regular rhythm Abdomen - gravid, Non-tender Extremities - peripheral pulses normal, no pedal edema, no clubbing or cyanosis, Homan's sign negative bilaterally NST:  Baseline: 130 bpm, Variability: Good {> 6 bpm), Accelerations: Reactive and Decelerations: Absent  Assessment Reactive NST GDM A2  Plan Continue routine care Fetal kick counts Keep CBGs and let us know

## 2018-04-18 NOTE — Discharge Instructions (Signed)
Gestational Diabetes Mellitus, Diagnosis Gestational diabetes (gestational diabetes mellitus) is a short-term (temporary) form of diabetes that can happen during pregnancy. It goes away after you give birth. It may be caused by one or both of these problems:  Your pancreas does not make enough of a hormone called insulin.  Your body does not respond in a normal way to insulin that it makes. Insulin lets sugars (glucose) go into cells in the body. This gives you energy. If you have diabetes, sugars cannot get into cells. This causes high blood sugar (hyperglycemia). If you get gestational diabetes, you are:  More likely to get it if you get pregnant again.  More likely to develop type 2 diabetes in the future. If gestational diabetes is treated, it may not hurt you or your baby. Your doctor will set treatment goals for you. In general, you should have these blood sugar levels:  After not eating for a long time (fasting): 95 mg/dL (5.3 mmol/L).  After meals (postprandial): ? One hour after a meal: at or below 140 mg/dL (7.8 mmol/L). ? Two hours after a meal: at or below 120 mg/dL (6.7 mmol/L).  A1c (hemoglobin A1c) level: 6-6.5%. Follow these instructions at home: Questions to ask your doctor   You may want to ask these questions: ? Do I need to meet with a diabetes educator? ? What equipment will I need to care for myself at home? ? What medicines do I need? When should I take them? ? How often do I need to check my blood sugar? ? What number can I call if I have questions? ? When is my next doctor's visit? General instructions  Take over-the-counter and prescription medicines only as told by your doctor.  Stay at a healthy weight during pregnancy.  Keep all follow-up visits as told by your doctor. This is important. Contact a doctor if:  Your blood sugar is at or above 240 mg/dL (13.3 mmol/L).  Your blood sugar is at or above 200 mg/dL (11.1 mmol/L) and you have ketones in  your pee (urine).  You have been sick or have had a fever for 2 days or more and you are not getting better.  You have any of these problems for more than 6 hours: ? You cannot eat or drink. ? You feel sick to your stomach (nauseous). ? You throw up (vomit). ? You have watery poop (diarrhea). Get help right away if:  Your blood sugar is lower than 54 mg/dL (3 mmol/L).  You get confused.  You have trouble: ? Thinking clearly. ? Breathing.  Your baby moves less than normal.  You have any of these: ? Moderate or large ketone levels in your pee. ? Blood coming from your vagina. ? Unusual fluid coming from your vagina. ? Early contractions. These may feel like tightness in your belly. Summary  Gestational diabetes is a short-term form of diabetes. It can happen while you are pregnant. It goes away after you give birth.  If gestational diabetes is treated, it may not hurt you or your baby. Your doctor will set treatment goals for you.  Keep all follow-up visits as told by your doctor. This is important. This information is not intended to replace advice given to you by your health care provider. Make sure you discuss any questions you have with your health care provider. Document Released: 07/12/2015 Document Revised: 11/13/2016 Document Reviewed: 04/23/2015 Elsevier Interactive Patient Education  2019 [employer]  

## 2018-04-18 NOTE — [facility] Note (Signed)
Pt sent from office for failed BPP.  Pt states + FM, denies UCs.

## 2018-04-22 ENCOUNTER — Other Ambulatory Visit (HOSPITAL_COMMUNITY): Payer: Self-pay | Admitting: *Deleted

## 2018-04-22 DIAGNOSIS — O24415 Gestational diabetes mellitus in pregnancy, controlled by oral hypoglycemic drugs: Secondary | ICD-10-CM

## 2018-04-24 ENCOUNTER — Ambulatory Visit: Payer: Self-pay

## 2018-04-24 ENCOUNTER — Ambulatory Visit (INDEPENDENT_AMBULATORY_CARE_PROVIDER_SITE_OTHER): Payer: Medicaid Other | Admitting: *Deleted

## 2018-04-24 VITALS — BP 117/67 | HR 89 | Wt 224.0 lb

## 2018-04-24 DIAGNOSIS — O24415 Gestational diabetes mellitus in pregnancy, controlled by oral hypoglycemic drugs: Secondary | ICD-10-CM

## 2018-04-24 NOTE — Progress Notes (Signed)
Pt stated she is having severe itching - mostly @ night which began 3 days ago. She says the itching "is unbearable". Bile acids to be drawn today. Pt advised she may take Benadryl OTC.   Pt informed that the ultrasound is considered a limited OB ultrasound and is not intended to be a complete ultrasound exam.  Patient also informed that the ultrasound is not being completed with the intent of assessing for fetal or placental anomalies or any pelvic abnormalities.  Explained that the purpose of today's ultrasound is to assess for presentation, BPP and amniotic fluid volume.  Patient acknowledges the purpose of the exam and the limitations of the study.

## 2018-04-25 LAB — BILE ACIDS, TOTAL: BILE ACIDS TOTAL: 4.1 umol/L (ref 0.0–10.0)

## 2018-05-01 ENCOUNTER — Ambulatory Visit: Payer: Self-pay

## 2018-05-01 ENCOUNTER — Ambulatory Visit (INDEPENDENT_AMBULATORY_CARE_PROVIDER_SITE_OTHER): Payer: Medicaid Other | Admitting: Obstetrics & Gynecology

## 2018-05-01 ENCOUNTER — Ambulatory Visit (INDEPENDENT_AMBULATORY_CARE_PROVIDER_SITE_OTHER): Payer: Medicaid Other | Admitting: *Deleted

## 2018-05-01 VITALS — BP 115/65 | HR 97 | Wt 223.9 lb

## 2018-05-01 DIAGNOSIS — O24415 Gestational diabetes mellitus in pregnancy, controlled by oral hypoglycemic drugs: Secondary | ICD-10-CM

## 2018-05-01 DIAGNOSIS — O0993 Supervision of high risk pregnancy, unspecified, third trimester: Secondary | ICD-10-CM

## 2018-05-01 NOTE — Progress Notes (Signed)

## 2018-05-01 NOTE — Progress Notes (Signed)
PRENATAL VISIT NOTE  Subjective:  Joy Wood is a 23 y.o. G1P0 at [redacted]w[redacted]d being seen today for ongoing prenatal care.  She is currently monitored for the following issues for this high-risk pregnancy and has History of prediabetes; PCOS (polycystic ovarian syndrome); Hyperlipidemia; Anemia; Supervision of high-risk pregnancy, third trimester; Rh negative state in antepartum period; Elevated BP without diagnosis of hypertension; and Gestational diabetes mellitus (GDM) in third trimester on their problem list.  Patient reports no complaints.  Contractions: Irregular. Vag. Bleeding: None.  Movement: Present. Denies leaking of fluid.   The following portions of the patient's history were reviewed and updated as appropriate: allergies, current medications, past family history, past medical history, past social history, past surgical history and problem list. Problem list updated.  Objective:   Vitals:   05/01/18 1441  BP: 115/65  Pulse: 97  Weight: 223 lb 14.4 oz (101.6 kg)    Fetal Status: Fetal Heart Rate (bpm): NST   Movement: Present     General:  Alert, oriented and cooperative. Patient is in no acute distress.  Skin: Skin is warm and dry. No rash noted.   Cardiovascular: Normal heart rate noted  Respiratory: Normal respiratory effort, no problems with respiration noted  Abdomen: Soft, gravid, appropriate for gestational age.  Pain/Pressure: Present     Pelvic: Cervical exam deferred        Extremities: Normal range of motion.  Edema: None  Mental Status: Normal mood and affect. Normal behavior. Normal judgment and thought content.   [place] Mfm Fetal Bpp Wo Non Stress  Result Date: 04/19/2018 ----------------------------------------------------------------------  OBSTETRICS REPORT                         (Corrected Final 04/19/2018 11:41                                                                            am)  ---------------------------------------------------------------------- Patient Info  ID #:       [id number]                          D.O.B.:  05-25-1995 (22 yrs)  Name:       Joy Wood                 Visit Date: 04/18/2018 04:06 pm ---------------------------------------------------------------------- Performed By  Performed By:     Hurman Horn          Secondary Phy.:    [facility]                                                              [facility]  Attending:        Lin Landsman      Address:           [facility]                    MD                                                              [facility]                                                              [address]                                                              [place], [place]                                                              [address]  Referred By:      Judeth Horn          Location:          [facility]                    CNM  Ref. Address:     [address] ---------------------------------------------------------------------- Orders   #  Description                           Code        Ordered By   1  [place] MFM OB FOLLOW UP                   [id number]    CATHERINE  WALLACE   2  [place] MFM FETAL BPP WO NON               [id number]    CATHERINE      STRESS                                            WALLACE  ----------------------------------------------------------------------   #  Order #                     Accession #                Episode #   1  [id number]                   [id number]                 [id number]   2  [id number]                   [id number]                 [id number]   ---------------------------------------------------------------------- Indications   Gestational diabetes in pregnancy,              O24.415   controlled by oral hypoglycemic drugs   (metformin)   [redacted] weeks gestation of pregnancy                 Z3A.33   Rh negative state in antepartum                 O36.0190  ---------------------------------------------------------------------- Fetal Evaluation  Num Of Fetuses:          1  Fetal Heart              134  Rate(bpm):  Cardiac Activity:        Observed  Presentation:            Cephalic  Placenta:                Posterior  Amniotic Fluid  AFI FV:      Within normal limits  AFI Sum(cm)     %Tile       Largest Pocket(cm)  14.93           53          5.3  RUQ(cm)       RLQ(cm)       LUQ(cm)        LLQ(cm)  4.83          2.48          2.32           5.3 ---------------------------------------------------------------------- Biophysical Evaluation  Amniotic F.V:   Within normal limits       F. Tone:         Observed  F. Movement:    Observed                   Score:           6/8  F. Breathing:   Not Observed ---------------------------------------------------------------------- Biometry  BPD:      92.5  mm     G. Age:  37w 4d       > 99  %    CI:         81.28  %  70 - 86                                                          FL/HC:       19.9  %    19.9 - 21.5  HC:      323.9  mm     G. Age:  36w 5d         91  %    HC/AC:       0.97       0.96 - 1.11  AC:      335.6  mm     G. Age:  37w 3d       > 97  %    FL/BPD:      69.6  %    71 - 87  FL:       64.4  mm     G. Age:  33w 2d         38  %    FL/AC:       19.2  %    20 - 24  Est. FW:    2921   g      6 lb 7 oz   > 90  %                     m ---------------------------------------------------------------------- OB History  Gravidity:    1         Term:   0        Prem:   0         SAB:   0  TOP:          0       Ectopic:  0        Living: 0 ----------------------------------------------------------------------  Gestational Age  LMP:           33w 2d        Date:  08/28/17                 EDD:    06/04/18  U/S Today:     36w 2d                                        EDD:    05/14/18  Best:          33w 2d     Det. By:  LMP  (08/28/17)          EDD:    06/04/18 ---------------------------------------------------------------------- Anatomy  Cranium:               Appears normal         Aortic Arch:            Previously seen  Cavum:                 Appears normal         Ductal Arch:            Previously seen  Ventricles:            Appears normal         Diaphragm:  Appears normal  Choroid Plexus:        Previously seen        Stomach:                Appears normal,                                                                        left sided  Cerebellum:            Previously seen        Abdomen:                Appears normal  Posterior Fossa:       Previously seen        Abdominal Wall:         Previously seen  Nuchal Fold:           Previously seen        Cord Vessels:           Previously seen  Face:                  Orbits and profile     Kidneys:                Appear normal                         previously seen  Lips:                  Previously seen        Bladder:                Appears normal  Thoracic:              Appears normal         Spine:                  Previously seen  Heart:                 Previously seen        Upper Extremities:      Previously seen  RVOT:                  Previously seen        Lower Extremities:      Previously seen  LVOT:                  Previously seen  Other:  Heels and Right 5th digit previously visualized. ---------------------------------------------------------------------- Impression  Normal interval growth.  Biophysical profile 6/8  A2GDM ---------------------------------------------------------------------- Recommendations  Continue weekly BPP with NST  Serial growth every 4 weeks. ----------------------------------------------------------------------                     Lin Landsmanorenthian Booker, MD Electronically Signed Corrected Final Report  04/19/2018 11:41 am ----------------------------------------------------------------------  [place] Mfm Ob Follow Up  Result Date: 04/19/2018 ----------------------------------------------------------------------  OBSTETRICS REPORT                         (Corrected Final 04/19/2018 11:41  am) ---------------------------------------------------------------------- Patient Info  ID #:       [id number]                          D.O.B.:  1995-09-11 (22 yrs)  Name:       Joy Wood                 Visit Date: 04/18/2018 04:06 pm ---------------------------------------------------------------------- Performed By  Performed By:     Hurman HornAlyssa Keatts          [facility]    [facility]                    [facility]                                                              [facility]                                                              [facility]                                                              [facility]  Attending:        Lin Landsmanorenthian Booker      Address:           [facility]                    MD                                                              [facility]                                                              [address]                                                              [address]  [place], [place]                                                              [address]  Referred By:      Judeth Horn          Location:          [facility]                    CNM  Ref. Address:     [address] ---------------------------------------------------------------------- Orders   #  Description                           Code        Ordered By   1  [place] MFM OB FOLLOW UP                   [id number]    Marcy Siren   2  [place] MFM FETAL BPP WO NON               [id number]    CATHERINE      STRESS                                            WALLACE  ----------------------------------------------------------------------   #  Order #                     Accession #                Episode #   1  [id number]                   [id number]                 [id number]   2  [id number]                   [id number]                 [id number]  ---------------------------------------------------------------------- Indications   Gestational diabetes in pregnancy,              O24.415   controlled by oral hypoglycemic drugs   (metformin)   [redacted] weeks gestation of pregnancy                 Z3A.33   Rh negative state in antepartum                 O36.0190  ---------------------------------------------------------------------- Fetal Evaluation  Num Of Fetuses:          1  Fetal Heart  134  Rate(bpm):  Cardiac Activity:        Observed  Presentation:            Cephalic  Placenta:                Posterior  Amniotic Fluid  AFI FV:      Within normal limits  AFI Sum(cm)     %Tile       Largest Pocket(cm)  14.93           53          5.3  RUQ(cm)       RLQ(cm)       LUQ(cm)        LLQ(cm)  4.83          2.48          2.32           5.3 ---------------------------------------------------------------------- Biophysical Evaluation  Amniotic F.V:   Within normal limits       F. Tone:         Observed  F. Movement:    Observed                   Score:           6/8  F. Breathing:   Not Observed ---------------------------------------------------------------------- Biometry  BPD:      92.5  mm     G. Age:  37w 4d       > 99  %    CI:         81.28  %    70 - 86                                                          FL/HC:       19.9  %    19.9 - 21.5  HC:      323.9  mm     G. Age:  36w 5d         91  %    HC/AC:       0.97       0.96 - 1.11  AC:      335.6  mm     G. Age:  37w 3d       > 97  %    FL/BPD:      69.6  %    71 -  87  FL:       64.4  mm     G. Age:  33w 2d         38  %    FL/AC:       19.2  %    20 - 24  Est. FW:    2921   g      6 lb 7 oz   > 90  %                     m ---------------------------------------------------------------------- OB History  Gravidity:    1         Term:   0        Prem:   0         SAB:   0  TOP:  0       Ectopic:  0        Living: 0 ---------------------------------------------------------------------- Gestational Age  LMP:           33w 2d        Date:  08/28/17                 EDD:    06/04/18  U/S Today:     36w 2d                                        EDD:    05/14/18  Best:          33w 2d     Det. By:  LMP  (08/28/17)          EDD:    06/04/18 ---------------------------------------------------------------------- Anatomy  Cranium:               Appears normal         Aortic Arch:            Previously seen  Cavum:                 Appears normal         Ductal Arch:            Previously seen  Ventricles:            Appears normal         Diaphragm:              Appears normal  Choroid Plexus:        Previously seen        Stomach:                Appears normal,                                                                        left sided  Cerebellum:            Previously seen        Abdomen:                Appears normal  Posterior Fossa:       Previously seen        Abdominal Wall:         Previously seen  Nuchal Fold:           Previously seen        Cord Vessels:           Previously seen  Face:                  Orbits and profile     Kidneys:                Appear normal                         previously seen  Lips:                  Previously seen        Bladder:  Appears normal  Thoracic:              Appears normal         Spine:                  Previously seen  Heart:                 Previously seen        Upper Extremities:      Previously seen  RVOT:                  Previously seen        Lower Extremities:      Previously seen  LVOT:                   Previously seen  Other:  Heels and Right 5th digit previously visualized. ---------------------------------------------------------------------- Impression  Normal interval growth.  Biophysical profile 6/8  A2GDM ---------------------------------------------------------------------- Recommendations  Continue weekly BPP with NST  Serial growth every 4 weeks. ----------------------------------------------------------------------                    Lin Landsman, MD Electronically Signed Corrected Final Report  04/19/2018 11:41 am ----------------------------------------------------------------------  US Fetal Bpp W/nonstress  Result Date: 04/25/2018 ----------------------------------------------------------------------  OBSTETRICS REPORT                       (Signed Final 04/25/2018 08:16 am) ---------------------------------------------------------------------- Patient Info  ID #:       [id number]                          D.O.B.:  Jun 17, 1995 (22 yrs)  Name:       Joy Wood                 Visit Date: 04/24/2018 03:29 pm ---------------------------------------------------------------------- Performed By  Performed By:     Sedalia Muta Day [employer]          Ref. Address:     [facility]                                                             [address]                                                             [address]  [place], [place]                                                             [address]  Attending:        Jaynie Collins         Location:         Center for                    MD                                       [facility]  Referred By:      [facility]                    [facility]                    Healthcare  ---------------------------------------------------------------------- Orders   #  Description                          Code         Ordered By   1  US FETAL BPP W/NONSTRESS             78295.6      Jaynie Collins  ----------------------------------------------------------------------   #  Order #                    Accession #                 Episode #   1  [id number]                  [id number]                  [id number]  ---------------------------------------------------------------------- Service(s) Provided   US Fetal BPP W NST                                   [phone]  ---------------------------------------------------------------------- Indications   [redacted] weeks gestation of pregnancy                Z3A.34   Gestational diabetes in pregnancy,             O24.415   controlled by oral hypoglycemic drugs  ---------------------------------------------------------------------- Fetal Evaluation  Num Of Fetuses:         1  Preg. Location:         Intrauterine  Cardiac Activity:       Observed  Presentation:           Cephalic  Amniotic Fluid  AFI  FV:      Within normal limits  AFI Sum(cm)     %Tile       Largest Pocket(cm)  14.11           50          4.43  RUQ(cm)       RLQ(cm)       LUQ(cm)        LLQ(cm)  4.43          2.82          3.28           3.58 ---------------------------------------------------------------------- Biophysical Evaluation  Amniotic F.V:   Pocket => 2 cm two         F. Tone:        Observed                  planes  F. Movement:    Observed                   N.S.T:          Reactive  F. Breathing:   Observed                   Score:          10/10 ---------------------------------------------------------------------- OB History  Gravidity:    1         Term:   0        Prem:   0        SAB:   0  TOP:          0       Ectopic:  0        Living: 0 ---------------------------------------------------------------------- Gestational Age  LMP:           34w 1d        Date:  08/28/17                 EDD:    06/04/18  Best:          34w 1d     Det. By:  LMP  (08/28/17)          EDD:   06/04/18 ---------------------------------------------------------------------- Impression  Antenatal testing is reassuring with BPP 10/10.  Normal  amniotic fluid volume. ---------------------------------------------------------------------- Recommendations  Continue weekly antenatal testing till delivery. ----------------------------------------------------------------------               Jaynie Collins, MD Electronically Signed Final Report   04/25/2018 08:16 am ----------------------------------------------------------------------   Assessment and Plan:  Pregnancy: G1P0 at [redacted]w[redacted]d  1. Gestational diabetes mellitus (GDM) in third trimester controlled on oral hypoglycemic drug CBGs within good control, continue Metformin 500mg  bid.  NST performed today was reviewed and was found to be reactive. Subsequent BPP performed today was also reviewed and was found to be 10/10. AFI was also normal. Continue recommended antenatal testing and prenatal care. - [place] MFM FETAL BPP WO NON STRESS; Future  2. Supervision of high-risk pregnancy, third trimester Preterm labor symptoms and general obstetric precautions including but not limited to vaginal bleeding, contractions, leaking of fluid and fetal movement were reviewed in detail with the patient. Please refer to After Visit Summary for other counseling recommendations.  Return in about 1 week (around 05/08/2018) for as scheduled. Pelvic cultures next week.   Future Appointments  Date Time Provider Department Center  05/08/2018  2:15 PM WOC-WOCA NST WOC-WOCA WOC  05/08/2018  3:15 PM Constant, Gigi Gin, MD  WOC-WOCA WOC  05/15/2018  1:45 PM WH-MFC [place] 5 WH-MFCUS MFC-US  05/15/2018  2:15 PM WOC-WOCA NST WOC-WOCA WOC  05/15/2018  3:15 PM Constant, Gigi Gin, MD WOC-WOCA WOC    Jaynie Collins, MD

## 2018-05-01 NOTE — Patient Instructions (Signed)
Return to clinic for any scheduled appointments or obstetric concerns, or go to MAU for evaluation  

## 2018-05-05 ENCOUNTER — Inpatient Hospital Stay (HOSPITAL_COMMUNITY)
Admission: AD | Admit: 2018-05-05 | Discharge: 2018-05-05 | Disposition: A | Discharge status: Home / Self Care | Payer: Medicaid Other | Attending: Obstetrics & Gynecology | Admitting: Obstetrics & Gynecology

## 2018-05-05 ENCOUNTER — Encounter (HOSPITAL_COMMUNITY): Payer: Self-pay | Admitting: *Deleted

## 2018-05-05 DIAGNOSIS — O99333 Smoking (tobacco) complicating pregnancy, third trimester: Secondary | ICD-10-CM | POA: Diagnosis not present

## 2018-05-05 DIAGNOSIS — O368130 Decreased fetal movements, third trimester, not applicable or unspecified: Secondary | ICD-10-CM

## 2018-05-05 DIAGNOSIS — R197 Diarrhea, unspecified: Secondary | ICD-10-CM | POA: Diagnosis not present

## 2018-05-05 DIAGNOSIS — Z3A35 35 weeks gestation of pregnancy: Secondary | ICD-10-CM

## 2018-05-05 DIAGNOSIS — O4703 False labor before 37 completed weeks of gestation, third trimester: Secondary | ICD-10-CM | POA: Insufficient documentation

## 2018-05-05 DIAGNOSIS — O479 False labor, unspecified: Secondary | ICD-10-CM

## 2018-05-05 LAB — BASIC METABOLIC PANEL
Anion gap: 10 (ref 5–15)
BUN: 9 mg/dL (ref 6–20)
CALCIUM: 8.7 mg/dL — AB (ref 8.9–10.3)
CO2: 16 mmol/L — ABNORMAL LOW (ref 22–32)
CREATININE: 0.5 mg/dL (ref 0.44–1.00)
Chloride: 107 mmol/L (ref 98–111)
GFR calc Af Amer: >60 mL/min (ref >60)
GFR calc non Af Amer: >60 mL/min (ref >60)
Glucose, Bld: 91 mg/dL (ref 70–99)
POTASSIUM: 3.9 mmol/L (ref 3.5–5.1)
SODIUM: 133 mmol/L — AB (ref 135–145)

## 2018-05-05 LAB — URINALYSIS, ROUTINE W REFLEX MICROSCOPIC
Bilirubin Urine: NEGATIVE
Glucose, UA: NEGATIVE mg/dL
HGB URINE DIPSTICK: NEGATIVE
Ketones, ur: NEGATIVE mg/dL
LEUKOCYTES UA: NEGATIVE
Nitrite: NEGATIVE
Protein, ur: NEGATIVE mg/dL
Specific Gravity, Urine: >1.03 — ABNORMAL HIGH (ref 1.005–1.030)
pH: 5.5 (ref 5.0–8.0)

## 2018-05-05 LAB — CBC
HCT: 33.3 % — ABNORMAL LOW (ref 36.0–46.0)
HEMOGLOBIN: 10.5 g/dL — AB (ref 12.0–15.0)
MCH: 25.5 pg — ABNORMAL LOW (ref 26.0–34.0)
MCHC: 31.5 g/dL (ref 30.0–36.0)
MCV: 80.8 fL (ref 80.0–100.0)
PLATELETS: 260 10*3/uL (ref 150–400)
RBC: 4.12 MIL/uL (ref 3.87–5.11)
RDW: 16.2 % — AB (ref 11.5–15.5)
WBC: 11 10*3/uL — AB (ref 4.0–10.5)
nRBC: 0 % (ref 0.0–0.2)

## 2018-05-05 MED ORDER — LOPERAMIDE HCL 2 MG PO CAPS
2.0000 mg | ORAL_CAPSULE | Freq: Once | ORAL | Status: AC
  Administered 2018-05-05: 2 mg via ORAL
  Filled 2018-05-05: qty 1

## 2018-05-05 NOTE — MAU Note (Signed)
Joy Wood is a 23 y.o. at 4473w5d here in MAU reporting: cramping, back pain, and diarrhea since yesterday morning. Pain is intermittent and come at the same time. No vaginal bleeding or LOF. Watery diarrhea x5-6 in the last 24 hours. Has not felt any fetal movement todau.  Onset of complaint: yesterday morning  Pain score: cramps and back pain are 8/10  Vitals:   05/05/18 1030  BP: 121/76  Pulse: (!) 116  Resp: 18  Temp: 98.5 F (36.9 C)      FHT:138  Lab orders placed from triage: UA and enteric precautions

## 2018-05-05 NOTE — Discharge Instructions (Signed)
We did not pick up any contractions on the monitor and your cervix was closed. You are likely feeling some braxton hicks and this can worsen when you are slightly dehydrated from having diarrhea. Make sure you continue to drink plenty of fluids. Your labs were all normal and you are safe to go home. You can buy imodium over the counter to help with diarrhea.   Call your OB Clinic or go to [facility] if:  You begin to have strong, frequent contractions  Your water breaks.  Sometimes it is a big gush of fluid, sometimes it is just a trickle that keeps getting your panties wet or running down your legs  You have vaginal bleeding.  It is normal to have a small amount of spotting if your cervix was checked.   You don't feel your baby moving like normal.  If you don't, get you something to eat and drink and lay down and focus on feeling your baby move.  You should feel at least 10 movements in 2 hours.  If you don't, you should call the office or go to [facility].    [facility] Contractions Contractions of the uterus can occur throughout pregnancy, but they are not always a sign that you are in labor. You may have practice contractions called Braxton Hicks contractions. These false labor contractions are sometimes confused with true labor. What are Joy Wood contractions? Braxton Hicks contractions are tightening movements that occur in the muscles of the uterus before labor. Unlike true labor contractions, these contractions do not result in opening (dilation) and thinning of the cervix. Toward the end of pregnancy (32-34 weeks), Braxton Hicks contractions can happen more often and may become stronger. These contractions are sometimes difficult to tell apart from true labor because they can be very uncomfortable. You should not feel embarrassed if you go to the hospital with false labor. Sometimes, the only way to tell if you are in true labor is for your health care provider to look  for changes in the cervix. The health care provider will do a physical exam and may monitor your contractions. If you are not in true labor, the exam should show that your cervix is not dilating and your water has not broken. If there are no other health problems associated with your pregnancy, it is completely safe for you to be sent home with false labor. You may continue to have Braxton Hicks contractions until you go into true labor. How to tell the difference between true labor and false labor True labor  Contractions last 30-70 seconds.  Contractions become very regular.  Discomfort is usually felt in the top of the uterus, and it spreads to the lower abdomen and low back.  Contractions do not go away with walking.  Contractions usually become more intense and increase in frequency.  The cervix dilates and gets thinner. False labor  Contractions are usually shorter and not as strong as true labor contractions.  Contractions are usually irregular.  Contractions are often felt in the front of the lower abdomen and in the groin.  Contractions may go away when you walk around or change positions while lying down.  Contractions get weaker and are shorter-lasting as time goes on.  The cervix usually does not dilate or become thin. Follow these instructions at home:   Take over-the-counter and prescription medicines only as told by your health care provider.  Keep up with your usual exercises and follow other instructions from your  health care provider.  Eat and drink lightly if you think you are going into labor.  If Braxton Hicks contractions are making you uncomfortable: ? Change your position from lying down or resting to walking, or change from walking to resting. ? Sit and rest in a tub of warm water. ? Drink enough fluid to keep your urine pale yellow. Dehydration may cause these contractions. ? Do slow and deep breathing several times an hour.  Keep all follow-up  prenatal visits as told by your health care provider. This is important. Contact a health care provider if:  You have a fever.  You have continuous pain in your abdomen. Get help right away if:  Your contractions become stronger, more regular, and closer together.  You have fluid leaking or gushing from your vagina.  You pass blood-tinged mucus (bloody show).  You have bleeding from your vagina.  You have low back pain that you never had before.  You feel your babys head pushing down and causing pelvic pressure.  Your baby is not moving inside you as much as it used to. Summary  Contractions that occur before labor are called Braxton Hicks contractions, false labor, or practice contractions.  Braxton Hicks contractions are usually shorter, weaker, farther apart, and less regular than true labor contractions. True labor contractions usually become progressively stronger and regular, and they become more frequent.  Manage discomfort from [facility] contractions by changing position, resting in a warm bath, drinking plenty of water, or practicing deep breathing. This information is not intended to replace advice given to you by your health care provider. Make sure you discuss any questions you have with your health care provider. Document Released: 08/03/2016 Document Revised: 01/02/2017 Document Reviewed: 08/03/2016 Elsevier Interactive Patient Education  2019 [employer].

## 2018-05-05 NOTE — MAU Provider Note (Signed)
**Note Joy-Identified via Obfuscation** History     CSN: [id number]  Arrival date and time: 05/05/18 1018   None     Chief Complaint  Patient presents with  . Abdominal Pain  . Back Pain  . Diarrhea  . Decreased Fetal Movement   HPI   Joy Wood is 23 y.o. G1P0 female at 7356w5d who presents for concerns of contractions, decreased fetal movement and diarrhea. Reports she has had some tightening across her abdomen as well as some back pain. This occurs irregularly and they have not grown stronger. Has been occurring since yesterday morning. No LOF or vaginal bleeding. Has not appreciated fetal movement today.   Also endorsing watery diarrhea since yesterday morning. Has had about 8 episodes total. Denies sick contacts, new foods, or recent travel.     OB History    Gravida  1   Para      Term      Preterm      AB      Living        SAB      TAB      Ectopic      Multiple      Live Births              Past Medical History:  Diagnosis Date  . Anemia 2017   iron deficiency  . Anxiety   . Diabetes mellitus without complication (HCC)    gestational; take metformin  . Hyperlipidemia 2018  . Menorrhagia with irregular cycle   . Onychomycosis of toenail 04/30/2017  . PCOS (polycystic ovarian syndrome) 2018   Diagnosed in [place]  History of ovarian cysts there with weight issues and insulin resistance.  . Weight gain     Past Surgical History:  Procedure Laterality Date  . WISDOM TOOTH EXTRACTION      Family History  Problem Relation Age of Onset  . Anemia Mother   . Diabetes Father   . Hyperlipidemia Father   . Hypertension Father   . Cancer Father     Social History   Tobacco Use  . Smoking status: Current Some Day Smoker  . Smokeless tobacco: Never Used  . Tobacco comment: Hookah--twice weekly  Substance Use Topics  . Alcohol use: No  . Drug use: No    Allergies: No Known Allergies  No medications prior to admission.    Review of Systems  Constitutional:  Negative for appetite change, chills and fever.  HENT: Negative for congestion and rhinorrhea.   Respiratory: Negative for cough and shortness of breath.   Cardiovascular: Negative for chest pain.  Gastrointestinal: Positive for diarrhea. Negative for abdominal pain, constipation, nausea, rectal pain and vomiting.  Genitourinary: Negative for difficulty urinating, dysuria, frequency, pelvic pain, vaginal discharge and vaginal pain.  Musculoskeletal: Positive for back pain.  Neurological: Negative for dizziness, weakness and light-headedness.   Physical Exam   Blood pressure 121/76, pulse (!) 116, temperature 98.5 F (36.9 C), temperature source Oral, resp. rate 18, height 5\' 4"  (1.626 m), weight 101.7 kg, last menstrual period 08/28/2017.  Physical Exam  Constitutional: She is oriented to person, place, and time. She appears well-developed and well-nourished. No distress.  HENT:  Head: Normocephalic and atraumatic.  Mouth/Throat: Oropharynx is clear and moist.  Eyes: Conjunctivae and EOM are normal.  Neck: Normal range of motion. Neck supple.  Cardiovascular: Regular rhythm and normal heart sounds.  Tachycardic.   Respiratory: Effort normal and breath sounds normal. No respiratory distress.  GI: Soft. She exhibits no  distension. There is no abdominal tenderness. There is no rebound and no guarding.  Gravid.   Genitourinary:    No vaginal discharge.     Genitourinary Comments: Cervix 0/thick/posterior. Vertex presentation.    Musculoskeletal: Normal range of motion.        General: No edema.  Neurological: She is alert and oriented to person, place, and time.  Skin: Skin is warm and dry. She is not diaphoretic.  Psychiatric: She has a normal mood and affect. Her behavior is normal.   FHT: 130 bpm, moderate variability, +acels, no decels  Toco: None   MAU Course  Procedures  MDM NST reactive. No contractions on toco. Cervix without change.  Imodium given. CBC and BMET drawn.    Assessment and Plan   1. Braxton Hick's contraction   2. Diarrhea, unspecified type   3. Decreased fetal movements in third trimester, single or unspecified fetus   4. [redacted] weeks gestation of pregnancy    Suspect contractions are braxton hicks as no contractions were noted during in MAU visit and cervix remains closed. Labor precautions reviewed.   Patient with reported decreased fetal movement today; however NST reactive with moderate variability. Discussed kick counts.  CBC and BMET within normal limits. Suspect diarrhea related to viral gastroenteritis. Recommended imodium prn and adequate hydration. Strict return precautions discussed.    Joy Wood 05/05/2018, 5:39 PM

## 2018-05-08 ENCOUNTER — Ambulatory Visit (INDEPENDENT_AMBULATORY_CARE_PROVIDER_SITE_OTHER): Payer: Medicaid Other | Admitting: Obstetrics and Gynecology

## 2018-05-08 ENCOUNTER — Ambulatory Visit: Payer: Self-pay

## 2018-05-08 ENCOUNTER — Ambulatory Visit (INDEPENDENT_AMBULATORY_CARE_PROVIDER_SITE_OTHER): Payer: Medicaid Other | Admitting: *Deleted

## 2018-05-08 ENCOUNTER — Other Ambulatory Visit (HOSPITAL_COMMUNITY)
Admission: RE | Admit: 2018-05-08 | Discharge: 2018-05-08 | Disposition: A | Discharge status: Home / Self Care | Payer: Medicaid Other | Source: Ambulatory Visit | Attending: Obstetrics and Gynecology | Admitting: Obstetrics and Gynecology

## 2018-05-08 ENCOUNTER — Encounter: Payer: Self-pay | Admitting: Obstetrics and Gynecology

## 2018-05-08 VITALS — BP 110/73 | HR 115 | Wt 225.3 lb

## 2018-05-08 DIAGNOSIS — O26893 Other specified pregnancy related conditions, third trimester: Secondary | ICD-10-CM

## 2018-05-08 DIAGNOSIS — O0993 Supervision of high risk pregnancy, unspecified, third trimester: Secondary | ICD-10-CM | POA: Diagnosis present

## 2018-05-08 DIAGNOSIS — O26899 Other specified pregnancy related conditions, unspecified trimester: Secondary | ICD-10-CM

## 2018-05-08 DIAGNOSIS — O24415 Gestational diabetes mellitus in pregnancy, controlled by oral hypoglycemic drugs: Secondary | ICD-10-CM | POA: Diagnosis not present

## 2018-05-08 DIAGNOSIS — Z6791 Unspecified blood type, Rh negative: Secondary | ICD-10-CM

## 2018-05-08 LAB — POCT URINALYSIS DIP (DEVICE)
GLUCOSE, UA: NEGATIVE mg/dL
Hgb urine dipstick: NEGATIVE
Nitrite: NEGATIVE
Protein, ur: 30 mg/dL — AB
Specific Gravity, Urine: 1.025 (ref 1.005–1.030)
Urobilinogen, UA: 1 mg/dL (ref 0.0–1.0)
pH: 6 (ref 5.0–8.0)

## 2018-05-08 NOTE — Progress Notes (Signed)
   PRENATAL VISIT NOTE  Subjective:  Joy Wood is a 23 y.o. G1P0 at [redacted]w[redacted]d being seen today for ongoing prenatal care.  She is currently monitored for the following issues for this high-risk pregnancy and has History of prediabetes; PCOS (polycystic ovarian syndrome); Hyperlipidemia; Anemia; Supervision of high-risk pregnancy, third trimester; Rh negative state in antepartum period; Elevated BP without diagnosis of hypertension; and Gestational diabetes mellitus (GDM) in third trimester on their problem list.  Patient reports no complaints.  Contractions: Not present. Vag. Bleeding: None.  Movement: Present. Denies leaking of fluid.   The following portions of the patient's history were reviewed and updated as appropriate: allergies, current medications, past family history, past medical history, past social history, past surgical history and problem list. Problem list updated.  Objective:   Vitals:   05/08/18 1436  BP: 110/73  Pulse: (!) 115  Weight: 225 lb 4.8 oz (102.2 kg)    Fetal Status: Fetal Heart Rate (bpm): NST   Movement: Present  Presentation: Vertex  General:  Alert, oriented and cooperative. Patient is in no acute distress.  Skin: Skin is warm and dry. No rash noted.   Cardiovascular: Normal heart rate noted  Respiratory: Normal respiratory effort, no problems with respiration noted  Abdomen: Soft, gravid, appropriate for gestational age.  Pain/Pressure: Present     Pelvic: Cervical exam performed Dilation: Closed Effacement (%): Thick Station: Ballotable  Extremities: Normal range of motion.  Edema: None  Mental Status: Normal mood and affect. Normal behavior. Normal judgment and thought content.   Assessment and Plan:  Pregnancy: G1P0 at [redacted]w[redacted]d  1. Supervision of high-risk pregnancy, third trimester Patient is doing well without complaints Cultures collected today - GC/Chlamydia probe amp ([place])not at [facility] - Strep Gp B NAA  2. Gestational diabetes  mellitus (GDM) in third trimester controlled on oral hypoglycemic drug CBGs reviewed and great majority within range. Patient admits to some dietary indiscretions with dinner Continue metformin Follow up growth ultrasound 2/12 Continue antenatal testing weekly NST today reviewed and reactive  BPP 8/8 Plan for IOL at 39 weeks  3. Rh negative state in antepartum period S/p rhogam  Preterm labor symptoms and general obstetric precautions including but not limited to vaginal bleeding, contractions, leaking of fluid and fetal movement were reviewed in detail with the patient. Please refer to After Visit Summary for other counseling recommendations.  Return in about 1 week (around 05/15/2018) for as scheduled.  Future Appointments  Date Time Provider Department Center  05/15/2018  1:45 PM WH-MFC [place] 5 WH-MFCUS MFC-US  05/15/2018  2:15 PM WOC-WOCA NST WOC-WOCA WOC  05/15/2018  3:15 PM Krystena Reitter, Gigi Gin, MD [facility] WOC    Catalina Antigua, MD

## 2018-05-08 NOTE — Progress Notes (Signed)
Pt had MAU visit on 2/2 due to contractions, decreased FM and diarrhea.  Follow up US for growth and BPP scheduled @ [facility] on 2/12.   Pt informed that the ultrasound is considered a limited OB ultrasound and is not intended to be a complete ultrasound exam.  Patient also informed that the ultrasound is not being completed with the intent of assessing for fetal or placental anomalies or any pelvic abnormalities.  Explained that the purpose of today's ultrasound is to assess for presentation, BPP and amniotic fluid volume.  Patient acknowledges the purpose of the exam and the limitations of the study.

## 2018-05-09 LAB — GC/CHLAMYDIA PROBE AMP (~~LOC~~) NOT AT ARMC
Chlamydia: NEGATIVE
Neisseria Gonorrhea: NEGATIVE

## 2018-05-10 LAB — STREP GP B NAA: Strep Gp B NAA: NEGATIVE

## 2018-05-15 ENCOUNTER — Encounter: Payer: Self-pay | Admitting: Obstetrics and Gynecology

## 2018-05-15 ENCOUNTER — Ambulatory Visit (INDEPENDENT_AMBULATORY_CARE_PROVIDER_SITE_OTHER): Payer: Medicaid Other | Admitting: *Deleted

## 2018-05-15 ENCOUNTER — Ambulatory Visit (HOSPITAL_COMMUNITY)
Admission: RE | Admit: 2018-05-15 | Discharge: 2018-05-15 | Disposition: A | Discharge status: Home / Self Care | Payer: Medicaid Other | Source: Ambulatory Visit | Attending: Maternal & Fetal Medicine | Admitting: Maternal & Fetal Medicine

## 2018-05-15 ENCOUNTER — Ambulatory Visit: Payer: Self-pay

## 2018-05-15 ENCOUNTER — Ambulatory Visit (INDEPENDENT_AMBULATORY_CARE_PROVIDER_SITE_OTHER): Payer: Medicaid Other | Admitting: Obstetrics and Gynecology

## 2018-05-15 VITALS — BP 120/69 | HR 90 | Wt 227.4 lb

## 2018-05-15 DIAGNOSIS — O24415 Gestational diabetes mellitus in pregnancy, controlled by oral hypoglycemic drugs: Secondary | ICD-10-CM

## 2018-05-15 DIAGNOSIS — O26893 Other specified pregnancy related conditions, third trimester: Secondary | ICD-10-CM

## 2018-05-15 DIAGNOSIS — O360130 Maternal care for anti-D [Rh] antibodies, third trimester, not applicable or unspecified: Secondary | ICD-10-CM | POA: Diagnosis not present

## 2018-05-15 DIAGNOSIS — O0993 Supervision of high risk pregnancy, unspecified, third trimester: Secondary | ICD-10-CM

## 2018-05-15 DIAGNOSIS — Z3A37 37 weeks gestation of pregnancy: Secondary | ICD-10-CM

## 2018-05-15 DIAGNOSIS — O26899 Other specified pregnancy related conditions, unspecified trimester: Secondary | ICD-10-CM

## 2018-05-15 DIAGNOSIS — Z6791 Unspecified blood type, Rh negative: Secondary | ICD-10-CM

## 2018-05-15 NOTE — Progress Notes (Signed)
Pt had [place] for growth and BPP today.

## 2018-05-15 NOTE — Progress Notes (Signed)
   PRENATAL VISIT NOTE  Subjective:  Joy Wood is a 23 y.o. G1P0 at [redacted]w[redacted]d being seen today for ongoing prenatal care.  She is currently monitored for the following issues for this high-risk pregnancy and has History of prediabetes; PCOS (polycystic ovarian syndrome); Hyperlipidemia; Anemia; Supervision of high-risk pregnancy, third trimester; Rh negative state in antepartum period; Elevated BP without diagnosis of hypertension; and Gestational diabetes mellitus (GDM) in third trimester on their problem list.  Patient reports no complaints.  Contractions: Irregular. Vag. Bleeding: None.  Movement: Present. Denies leaking of fluid.   The following portions of the patient's history were reviewed and updated as appropriate: allergies, current medications, past family history, past medical history, past social history, past surgical history and problem list. Problem list updated.  Objective:   Vitals:   05/15/18 1510  BP: 120/69  Pulse: 90  Weight: 227 lb 6.4 oz (103.1 kg)    Fetal Status: Fetal Heart Rate (bpm): NST   Movement: Present     General:  Alert, oriented and cooperative. Patient is in no acute distress.  Skin: Skin is warm and dry. No rash noted.   Cardiovascular: Normal heart rate noted  Respiratory: Normal respiratory effort, no problems with respiration noted  Abdomen: Soft, gravid, appropriate for gestational age.  Pain/Pressure: Present     Pelvic: Cervical exam deferred        Extremities: Normal range of motion.  Edema: None  Mental Status: Normal mood and affect. Normal behavior. Normal judgment and thought content.   Assessment and Plan:  Pregnancy: G1P0 at [redacted]w[redacted]d  1. Supervision of high-risk pregnancy, third trimester Patient is doing well without complaints  2. Gestational diabetes mellitus (GDM) in third trimester controlled on oral hypoglycemic drug CBGs reviewed and all within range Continue current metformin regimen IOL scheduled at 39 weeks Follow  up growth report from today NST reviewed and reactive  3. Rh negative state in antepartum period S/p rhogam  Term labor symptoms and general obstetric precautions including but not limited to vaginal bleeding, contractions, leaking of fluid and fetal movement were reviewed in detail with the patient. Please refer to After Visit Summary for other counseling recommendations.  Return in about 1 week (around 05/22/2018) for [facility] and NST/BPP.  Future Appointments  Date Time Provider Department Center  05/28/2018  7:30 AM WH-BSSCHED ROOM WH-BSSCHED None    Catalina Antigua, MD

## 2018-05-16 ENCOUNTER — Ambulatory Visit (HOSPITAL_COMMUNITY): Payer: Medicaid Other

## 2018-05-17 ENCOUNTER — Telehealth (HOSPITAL_COMMUNITY): Payer: Self-pay | Admitting: *Deleted

## 2018-05-17 NOTE — Telephone Encounter (Signed)
Preadmission screen  

## 2018-05-23 ENCOUNTER — Ambulatory Visit (INDEPENDENT_AMBULATORY_CARE_PROVIDER_SITE_OTHER): Payer: Medicaid Other | Admitting: Obstetrics & Gynecology

## 2018-05-23 ENCOUNTER — Ambulatory Visit (INDEPENDENT_AMBULATORY_CARE_PROVIDER_SITE_OTHER): Payer: Medicaid Other | Admitting: *Deleted

## 2018-05-23 ENCOUNTER — Ambulatory Visit: Payer: Self-pay

## 2018-05-23 VITALS — BP 125/67 | HR 116 | Wt 232.9 lb

## 2018-05-23 DIAGNOSIS — O26899 Other specified pregnancy related conditions, unspecified trimester: Secondary | ICD-10-CM

## 2018-05-23 DIAGNOSIS — O24415 Gestational diabetes mellitus in pregnancy, controlled by oral hypoglycemic drugs: Secondary | ICD-10-CM

## 2018-05-23 DIAGNOSIS — Z6791 Unspecified blood type, Rh negative: Secondary | ICD-10-CM

## 2018-05-23 DIAGNOSIS — O360130 Maternal care for anti-D [Rh] antibodies, third trimester, not applicable or unspecified: Secondary | ICD-10-CM

## 2018-05-23 DIAGNOSIS — O0993 Supervision of high risk pregnancy, unspecified, third trimester: Secondary | ICD-10-CM

## 2018-05-23 DIAGNOSIS — Z3A38 38 weeks gestation of pregnancy: Secondary | ICD-10-CM

## 2018-05-23 DIAGNOSIS — O26893 Other specified pregnancy related conditions, third trimester: Secondary | ICD-10-CM

## 2018-05-23 MED ORDER — METFORMIN HCL ER 500 MG PO TB24: ORAL_TABLET | ORAL | 0 refills | Status: DC

## 2018-05-23 NOTE — Progress Notes (Signed)
   PRENATAL VISIT NOTE  Subjective:  Joy Wood is a 23 y.o. G1P0 at [redacted]w[redacted]d being seen today for ongoing prenatal care.  She is currently monitored for the following issues for this high-risk pregnancy and has History of prediabetes; PCOS (polycystic ovarian syndrome); Hyperlipidemia; Anemia; Supervision of high-risk pregnancy, third trimester; Rh negative state in antepartum period; Elevated BP without diagnosis of hypertension; and Gestational diabetes mellitus (GDM) in third trimester on their problem list.  Patient reports no complaints.  Contractions: Not present. Vag. Bleeding: None.  Movement: Present. Denies leaking of fluid.   The following portions of the patient's history were reviewed and updated as appropriate: allergies, current medications, past family history, past medical history, past social history, past surgical history and problem list. Problem list updated.  Objective:   Vitals:   05/23/18 1431  BP: 125/67  Pulse: (!) 116  Weight: 232 lb 14.4 oz (105.6 kg)    Fetal Status: Fetal Heart Rate (bpm): NST   Movement: Present     General:  Alert, oriented and cooperative. Patient is in no acute distress.  Skin: Skin is warm and dry. No rash noted.   Cardiovascular: Normal heart rate noted  Respiratory: Normal respiratory effort, no problems with respiration noted  Abdomen: Soft, gravid, appropriate for gestational age.  Pain/Pressure: Present     Pelvic: Cervical exam performed      cervix was VERY post but, soft. Unable to reach os.   Extremities: Normal range of motion.  Edema: None  Mental Status: Normal mood and affect. Normal behavior. Normal judgment and thought content.   Assessment and Plan:  Pregnancy: G1P0 at [redacted]w[redacted]d  1. Supervision of high-risk pregnancy, third trimester NST reviewed and reactive.   2. Gestational diabetes mellitus (GDM) in third trimester controlled on oral hypoglycemic drug Almost all of the fasting glucose values are elevated.   Also pt ran our of meds a few days ago and was not aware that she should get a refill.  She had elevated fasting glucose prior to this lapse .    Will increase Metformin to 1oomg qhs and keep 500mg  in the am   Reviewed IOL    3. Rh negative state in antepartum period S/p Rhogam    Term labor symptoms and general obstetric precautions including but not limited to vaginal bleeding, contractions, leaking of fluid and fetal movement were reviewed in detail with the patient. Please refer to After Visit Summary for other counseling recommendations.  Return for PP visit w/2hr GTT.  IOL on 2/25.  Future Appointments  Date Time Provider Department Center  05/28/2018  7:30 AM WH-BSSCHED ROOM WH-BSSCHED None    Willodean Rosenthal, MD

## 2018-05-23 NOTE — Patient Instructions (Signed)
Labor Induction    Labor induction is when steps are taken to cause a pregnant woman to begin the labor process. Most women go into labor on their own between 37 weeks and 42 weeks of pregnancy. When this does not happen or when there is a medical need for labor to begin, steps may be taken to induce labor. Labor induction causes a pregnant woman's uterus to contract. It also causes the cervix to soften (ripen), open (dilate), and thin out (efface). Usually, labor is not induced before 39 weeks of pregnancy unless there is a medical reason to do so. Your health care provider will determine if labor induction is needed.  Before inducing labor, your health care provider will consider a number of factors, including:  · Your medical condition and your baby's.  · How many weeks along you are in your pregnancy.  · How mature your baby's lungs are.  · The condition of your cervix.  · The position of your baby.  · The size of your birth canal.  What are some reasons for labor induction?  Labor may be induced if:  · Your health or your baby's health is at risk.  · Your pregnancy is overdue by 1 week or more.  · Your water breaks but labor does not start on its own.  · There is a low amount of amniotic fluid around your baby.  You may also choose (elect) to have labor induced at a certain time. Generally, elective labor induction is done no earlier than 39 weeks of pregnancy.  What methods are used for labor induction?  Methods used for labor induction include:  · Prostaglandin medicine. This medicine starts contractions and causes the cervix to dilate and ripen. It can be taken by mouth (orally) or by being inserted into the vagina (suppository).  · Inserting a small, thin tube (catheter) with a balloon into the vagina and then expanding the balloon with water to dilate the cervix.  · Stripping the membranes. In this method, your health care provider gently separates amniotic sac tissue from the cervix. This causes the  cervix to stretch, which in turn causes the release of a hormone called progesterone. The hormone causes the uterus to contract. This procedure is often done during an office visit, after which you will be sent home to wait for contractions to begin.  · Breaking the water. In this method, your health care provider uses a small instrument to make a small hole in the amniotic sac. This eventually causes the amniotic sac to break. Contractions should begin after a few hours.  · Medicine to trigger or strengthen contractions. This medicine is given through an IV that is inserted into a vein in your arm.  Except for membrane stripping, which can be done in a clinic, labor induction is done in the hospital so that you and your baby can be carefully monitored.  How long does it take for labor to be induced?  The length of time it takes to induce labor depends on how ready your body is for labor. Some inductions can take up to 2-3 days, while others may take less than a day. Induction may take longer if:  · You are induced early in your pregnancy.  · It is your first pregnancy.  · Your cervix is not ready.  What are some risks associated with labor induction?  Some risks associated with labor induction include:  · Changes in fetal heart rate, such as being too   high, too low, or irregular (erratic).  · Failed induction.  · Infection in the mother or the baby.  · Increased risk of having a cesarean delivery.  · Fetal death.  · Breaking off (abruption) of the placenta from the uterus (rare).  · Rupture of the uterus (very rare).  When induction is needed for medical reasons, the benefits of induction generally outweigh the risks.  What are some reasons for not inducing labor?  Labor induction should not be done if:  · Your baby does not tolerate contractions.  · You have had previous surgeries on your uterus, such as a myomectomy, removal of fibroids, or a vertical scar from a previous cesarean delivery.  · Your placenta lies  very low in your uterus and blocks the opening of the cervix (placenta previa).  · Your baby is not in a head-down position.  · The umbilical cord drops down into the birth canal in front of the baby.  · There are unusual circumstances, such as the baby being very early (premature).  · You have had more than 2 previous cesarean deliveries.  Summary  · Labor induction is when steps are taken to cause a pregnant woman to begin the labor process.  · Labor induction causes a pregnant woman's uterus to contract. It also causes the cervix to ripen, dilate, and efface.  · Labor is not induced before 39 weeks of pregnancy unless there is a medical reason to do so.  · When induction is needed for medical reasons, the benefits of induction generally outweigh the risks.  This information is not intended to replace advice given to you by your health care provider. Make sure you discuss any questions you have with your health care provider.  Document Released: 08/09/2006 Document Revised: 05/03/2016 Document Reviewed: 05/03/2016  Elsevier Interactive Patient Education © 2019 [employer]

## 2018-05-23 NOTE — Progress Notes (Addendum)
Pt states she ran out of Metformin 2 days ago and did not realize she had a refill on the Rx. Pt advised to call pharmacy for the refill and take as directed. Pt desires Cx exam today. IOL scheduled on 2/25.   Pt informed that the ultrasound is considered a limited OB ultrasound and is not intended to be a complete ultrasound exam.  Patient also informed that the ultrasound is not being completed with the intent of assessing for fetal or placental anomalies or any pelvic abnormalities.  Explained that the purpose of today's ultrasound is to assess for presentation, BPP and amniotic fluid volume.  Patient acknowledges the purpose of the exam and the limitations of the study.    Attestation of Attending Supervision of RN: Evaluation and management procedures were performed by the nurse under my supervision and collaboration.  I have reviewed the nursing note and chart, and I agree with the management and plan.  Carolyn L. Harraway-Smith, M.D., [facility]

## 2018-05-28 ENCOUNTER — Inpatient Hospital Stay (HOSPITAL_COMMUNITY): Payer: Medicaid Other | Admitting: Anesthesiology

## 2018-05-28 ENCOUNTER — Inpatient Hospital Stay (HOSPITAL_COMMUNITY)
Admission: AD | Admit: 2018-05-28 | Discharge: 2018-05-31 | DRG: 805 | Disposition: A | Discharge status: Home / Self Care | Payer: Medicaid Other | Attending: Obstetrics and Gynecology | Admitting: Obstetrics and Gynecology

## 2018-05-28 ENCOUNTER — Encounter (HOSPITAL_COMMUNITY): Payer: Self-pay | Admitting: *Deleted

## 2018-05-28 ENCOUNTER — Inpatient Hospital Stay (HOSPITAL_COMMUNITY)
Admission: RE | Admit: 2018-05-28 | Discharge: 2018-05-28 | Disposition: A | Discharge status: Home / Self Care | Payer: Medicaid Other | Source: Ambulatory Visit | Attending: Family Medicine | Admitting: Family Medicine

## 2018-05-28 DIAGNOSIS — Z6791 Unspecified blood type, Rh negative: Secondary | ICD-10-CM | POA: Diagnosis not present

## 2018-05-28 DIAGNOSIS — O26893 Other specified pregnancy related conditions, third trimester: Secondary | ICD-10-CM | POA: Diagnosis present

## 2018-05-28 DIAGNOSIS — E282 Polycystic ovarian syndrome: Secondary | ICD-10-CM | POA: Diagnosis present

## 2018-05-28 DIAGNOSIS — O9902 Anemia complicating childbirth: Secondary | ICD-10-CM | POA: Diagnosis present

## 2018-05-28 DIAGNOSIS — O24425 Gestational diabetes mellitus in childbirth, controlled by oral hypoglycemic drugs: Principal | ICD-10-CM | POA: Diagnosis present

## 2018-05-28 DIAGNOSIS — O26899 Other specified pregnancy related conditions, unspecified trimester: Secondary | ICD-10-CM

## 2018-05-28 DIAGNOSIS — Z3A39 39 weeks gestation of pregnancy: Secondary | ICD-10-CM

## 2018-05-28 DIAGNOSIS — R030 Elevated blood-pressure reading, without diagnosis of hypertension: Secondary | ICD-10-CM | POA: Diagnosis present

## 2018-05-28 DIAGNOSIS — Z8632 Personal history of gestational diabetes: Secondary | ICD-10-CM | POA: Diagnosis present

## 2018-05-28 DIAGNOSIS — Z3490 Encounter for supervision of normal pregnancy, unspecified, unspecified trimester: Secondary | ICD-10-CM | POA: Diagnosis present

## 2018-05-28 DIAGNOSIS — D509 Iron deficiency anemia, unspecified: Secondary | ICD-10-CM | POA: Diagnosis present

## 2018-05-28 DIAGNOSIS — O24415 Gestational diabetes mellitus in pregnancy, controlled by oral hypoglycemic drugs: Secondary | ICD-10-CM

## 2018-05-28 DIAGNOSIS — Z87891 Personal history of nicotine dependence: Secondary | ICD-10-CM | POA: Diagnosis not present

## 2018-05-28 DIAGNOSIS — O411230 Chorioamnionitis, third trimester, not applicable or unspecified: Secondary | ICD-10-CM | POA: Diagnosis present

## 2018-05-28 DIAGNOSIS — O0993 Supervision of high risk pregnancy, unspecified, third trimester: Secondary | ICD-10-CM

## 2018-05-28 LAB — CBC
HCT: 34.9 % — ABNORMAL LOW (ref 36.0–46.0)
Hemoglobin: 10.7 g/dL — ABNORMAL LOW (ref 12.0–15.0)
MCH: 24.4 pg — ABNORMAL LOW (ref 26.0–34.0)
MCHC: 30.7 g/dL (ref 30.0–36.0)
MCV: 79.7 fL — AB (ref 80.0–100.0)
Platelets: 260 10*3/uL (ref 150–400)
RBC: 4.38 MIL/uL (ref 3.87–5.11)
RDW: 16.6 % — AB (ref 11.5–15.5)
WBC: 10.4 10*3/uL (ref 4.0–10.5)
nRBC: 0 % (ref 0.0–0.2)

## 2018-05-28 LAB — GLUCOSE, CAPILLARY
Glucose-Capillary: 85 mg/dL (ref 70–99)
Glucose-Capillary: 83 mg/dL (ref 70–99)
Glucose-Capillary: 71 mg/dL (ref 70–99)

## 2018-05-28 LAB — COMPREHENSIVE METABOLIC PANEL
ALK PHOS: 100 U/L (ref 38–126)
ALT: 20 U/L (ref 0–44)
AST: 26 U/L (ref 15–41)
Albumin: 2.6 g/dL — ABNORMAL LOW (ref 3.5–5.0)
Anion gap: 9 (ref 5–15)
BILIRUBIN TOTAL: 0.5 mg/dL (ref 0.3–1.2)
BUN: 9 mg/dL (ref 6–20)
CO2: 18 mmol/L — ABNORMAL LOW (ref 22–32)
Calcium: 9.3 mg/dL (ref 8.9–10.3)
Chloride: 107 mmol/L (ref 98–111)
Creatinine, Ser: 0.6 mg/dL (ref 0.44–1.00)
GFR calc Af Amer: >60 mL/min (ref >60)
GFR calc non Af Amer: >60 mL/min (ref >60)
Glucose, Bld: 88 mg/dL (ref 70–99)
Potassium: 4.1 mmol/L (ref 3.5–5.1)
Sodium: 134 mmol/L — ABNORMAL LOW (ref 135–145)
TOTAL PROTEIN: 5.8 g/dL — AB (ref 6.5–8.1)

## 2018-05-28 LAB — PROTEIN / CREATININE RATIO, URINE
CREATININE, URINE: 158.6 mg/dL
Protein Creatinine Ratio: 0.19 mg/mg{Cre} — ABNORMAL HIGH (ref 0.00–0.15)
Total Protein, Urine: 30 mg/dL

## 2018-05-28 MED ORDER — FENTANYL CITRATE (PF) 100 MCG/2ML IJ SOLN
INTRAMUSCULAR | Status: AC
  Filled 2018-05-28: qty 2

## 2018-05-28 MED ORDER — SOD CITRATE-CITRIC ACID 500-334 MG/5ML PO SOLN
30.0000 mL | ORAL | Status: DC | PRN
  Administered 2018-05-28: 30 mL via ORAL
  Filled 2018-05-28: qty 15

## 2018-05-28 MED ORDER — OXYCODONE-ACETAMINOPHEN 5-325 MG PO TABS: 2.0000 | ORAL_TABLET | ORAL | Status: DC | PRN

## 2018-05-28 MED ORDER — METFORMIN HCL ER 500 MG PO TB24
500.0000 mg | ORAL_TABLET | Freq: Two times a day (BID) | ORAL | Status: DC
  Filled 2018-05-28 (×3): qty 1

## 2018-05-28 MED ORDER — TERBUTALINE SULFATE 1 MG/ML IJ SOLN: 0.2500 mg | Freq: Once | INTRAMUSCULAR | Status: DC | PRN

## 2018-05-28 MED ORDER — LIDOCAINE HCL (PF) 1 % IJ SOLN: 30.0000 mL | INTRAMUSCULAR | Status: DC | PRN

## 2018-05-28 MED ORDER — OXYCODONE-ACETAMINOPHEN 5-325 MG PO TABS: 1.0000 | ORAL_TABLET | ORAL | Status: DC | PRN

## 2018-05-28 MED ORDER — DIPHENHYDRAMINE HCL 50 MG/ML IJ SOLN: 12.5000 mg | INTRAMUSCULAR | Status: DC | PRN

## 2018-05-28 MED ORDER — EPHEDRINE 5 MG/ML INJ: 10.0000 mg | INTRAVENOUS | Status: DC | PRN

## 2018-05-28 MED ORDER — PROMETHAZINE HCL 25 MG/ML IJ SOLN
12.5000 mg | Freq: Once | INTRAMUSCULAR | Status: AC
  Administered 2018-05-28: 12.5 mg via INTRAVENOUS
  Filled 2018-05-28: qty 1

## 2018-05-28 MED ORDER — OXYTOCIN 40 UNITS IN NORMAL SALINE INFUSION - SIMPLE MED
2.5000 [IU]/h | INTRAVENOUS | Status: DC
  Filled 2018-05-28: qty 1000

## 2018-05-28 MED ORDER — PHENYLEPHRINE 40 MCG/ML (10ML) SYRINGE FOR IV PUSH (FOR BLOOD PRESSURE SUPPORT): 80.0000 ug | PREFILLED_SYRINGE | INTRAVENOUS | Status: DC | PRN

## 2018-05-28 MED ORDER — MISOPROSTOL 100 MCG PO TABS
50.0000 ug | ORAL_TABLET | ORAL | Status: DC
  Administered 2018-05-28 (×2): 50 ug via ORAL
  Filled 2018-05-28 (×4): qty 1

## 2018-05-28 MED ORDER — OXYTOCIN 40 UNITS IN NORMAL SALINE INFUSION - SIMPLE MED
1.0000 m[IU]/min | INTRAVENOUS | Status: DC
  Administered 2018-05-28: 2 m[IU]/min via INTRAVENOUS

## 2018-05-28 MED ORDER — FENTANYL-BUPIVACAINE-NACL 0.5-0.125-0.9 MG/250ML-% EP SOLN
12.0000 mL/h | EPIDURAL | Status: DC | PRN
  Filled 2018-05-28: qty 250

## 2018-05-28 MED ORDER — LACTATED RINGERS IV SOLN
500.0000 mL | INTRAVENOUS | Status: DC | PRN
  Administered 2018-05-29: 500 mL via INTRAVENOUS

## 2018-05-28 MED ORDER — FENTANYL CITRATE (PF) 100 MCG/2ML IJ SOLN
100.0000 ug | INTRAMUSCULAR | Status: DC | PRN
  Administered 2018-05-28 (×2): 100 ug via INTRAVENOUS
  Filled 2018-05-28: qty 2

## 2018-05-28 MED ORDER — LIDOCAINE HCL (PF) 1 % IJ SOLN
INTRAMUSCULAR | Status: DC | PRN
  Administered 2018-05-28: 11 mL via EPIDURAL

## 2018-05-28 MED ORDER — LACTATED RINGERS IV SOLN
500.0000 mL | Freq: Once | INTRAVENOUS | Status: AC
  Administered 2018-05-28: 500 mL via INTRAVENOUS

## 2018-05-28 MED ORDER — BUTORPHANOL TARTRATE 1 MG/ML IJ SOLN
2.0000 mg | Freq: Once | INTRAMUSCULAR | Status: AC
  Administered 2018-05-28: 2 mg via INTRAVENOUS
  Filled 2018-05-28: qty 2

## 2018-05-28 MED ORDER — ONDANSETRON HCL 4 MG/2ML IJ SOLN: 4.0000 mg | Freq: Four times a day (QID) | INTRAMUSCULAR | Status: DC | PRN

## 2018-05-28 MED ORDER — SODIUM CHLORIDE (PF) 0.9 % IJ SOLN
INTRAMUSCULAR | Status: DC | PRN
  Administered 2018-05-28: 12 mL/h via EPIDURAL

## 2018-05-28 MED ORDER — ACETAMINOPHEN 325 MG PO TABS: 650.0000 mg | ORAL_TABLET | ORAL | Status: DC | PRN

## 2018-05-28 MED ORDER — LACTATED RINGERS IV SOLN
INTRAVENOUS | Status: DC
  Administered 2018-05-28 – 2018-05-29 (×3): via INTRAVENOUS

## 2018-05-28 MED ORDER — OXYTOCIN BOLUS FROM INFUSION
500.0000 mL | Freq: Once | INTRAVENOUS | Status: AC
  Administered 2018-05-29: 500 mL via INTRAVENOUS

## 2018-05-28 MED ORDER — PHENYLEPHRINE 40 MCG/ML (10ML) SYRINGE FOR IV PUSH (FOR BLOOD PRESSURE SUPPORT)
80.0000 ug | PREFILLED_SYRINGE | INTRAVENOUS | Status: DC | PRN
  Filled 2018-05-28: qty 10

## 2018-05-28 NOTE — H&P (Addendum)
LABOR AND DELIVERY ADMISSION HISTORY AND PHYSICAL NOTE  LANGLEY VANDERMEULEN is a 23 y.o. female G1P0 with IUP at [redacted]w[redacted]d by 1st trimester U/S in [place] presenting for IOL for GDM. EFW 3434g (7lb 9oz) 87% on 2/12 She reports positive fetal movement. She denies leakage of fluid or vaginal bleeding.  Prenatal History/Complications: PNC at [facility] Pregnancy complications:  - H/o Prediabetes > uncontrolled GDM on Metformin - PCOS - Iron Deficiency Anemia - Elevated BP without diagnosis of HTN  - Rh negative  Past Medical History: Past Medical History:  Diagnosis Date  . Anemia 2017   iron deficiency  . Anxiety   . Diabetes mellitus without complication (HCC)    gestational; take metformin  . Hyperlipidemia 2018  . Menorrhagia with irregular cycle   . Onychomycosis of toenail 04/30/2017  . PCOS (polycystic ovarian syndrome) 2018   Diagnosed in [place].  History of ovarian cysts there with weight issues and insulin resistance.  . Weight gain     Past Surgical History: Past Surgical History:  Procedure Laterality Date  . WISDOM TOOTH EXTRACTION      Obstetrical History: OB History    Gravida  1   Para      Term      Preterm      AB      Living        SAB      TAB      Ectopic      Multiple      Live Births              Social History: Social History   Socioeconomic History  . Marital status: Significant Other    Spouse name: Not on file  . Number of children: 0  . Years of education: [employer]  . Highest education level: Not on file  Occupational History  . Occupation: housewife  Social Needs  . Financial resource strain: Not hard at all  . Food insecurity:    Worry: Never true    Inability: Never true  . Transportation needs:    Medical: No    Non-medical: Not on file  Tobacco Use  . Smoking status: Former Games developer  . Smokeless tobacco: Never Used  . Tobacco comment: Hookah--twice weekly  Substance and Sexual Activity  . Alcohol  use: No  . Drug use: No  . Sexual activity: Yes  Lifestyle  . Physical activity:    Days per week: 3 days    Minutes per session: 60 min  . Stress: To some extent  Relationships  . Social connections:    Talks on phone: More than three times a week    Gets together: More than three times a week    Attends religious service: More than 4 times per year    Active member of club or organization: No    Attends meetings of clubs or organizations: Never    Relationship status: Married  Other Topics Concern  . Not on file  Social History Narrative   Lives in [place]   Married and bored at home still (2018).     Would like to be doing makeup    Family History: Family History  Problem Relation Age of Onset  . Anemia Mother   . Diabetes Father   . Hyperlipidemia Father   . Hypertension Father   . Cancer Father     Allergies: No Known Allergies  Medications Prior to Admission  Medication Sig Dispense Refill  Last Dose  . ACCU-CHEK FASTCLIX LANCETS MISC 1 Device by Percutaneous route 4 (four) times daily. 100 each 12 Taking  . diphenhydrAMINE (BENADRYL) 25 MG tablet Take 25 mg by mouth at bedtime as needed.   Taking  . famotidine (PEPCID) 20 MG tablet Take 20 mg by mouth daily.   Taking  . glucose blood (ACCU-CHEK GUIDE) test strip Use as instructed QID 100 each 12 Taking  . metFORMIN (GLUCOPHAGE-XR) 500 MG 24 hr tablet Take 1 tablet (500 mg) @ breakfast and 2 tablets (1000 mg) @ bedtime 15 tablet 0   . Prenatal Vit-Fe Fumarate-FA (PRENATAL VITAMIN PO) Take 1 tablet by mouth daily.   Taking     Review of Systems  All systems reviewed and negative except as stated in HPI  Physical Exam Blood pressure (!) 144/86, pulse 91, temperature 98.6 F (37 C), temperature source Oral, resp. rate 18, height 5\' 4"  (1.626 m), weight 104.3 kg, last menstrual period 08/28/2017. General appearance: alert, oriented, NAD Lungs: normal respiratory effort Heart: regular rate Abdomen: soft,  non-tender; gravid, FH appropriate for GA Extremities: No calf swelling or tenderness Presentation: cephalic Fetal monitoring: baseline 135 bpm, moderate variability, + acels, no decels Uterine activity: Variable Dilation: 2 Effacement (%): 50 Station: -1 Exam by:: raney gagnon rnc  Prenatal labs: ABO, Rh: A/Negative/-- (08/21 1550) Antibody: Negative (12/20 0822) Rubella: 1.25 (08/21 1550) RPR: Non Reactive (12/20 0822)  HBsAg: Negative (08/21 1550)  HIV: Non Reactive (12/20 0822)  GC/Chlamydia: Negative GBS: Negative (02/05 1554)  2-hr GTT: 97/190/145 Genetic screening:  NIPS: low risk female, AFP neg, CF: neg, SMA 2/low risk Anatomy US: Normal  Prenatal Transfer Tool  Maternal Diabetes: Yes:  Diabetes Type:  Insulin/Medication controlled Genetic Screening: Normal Maternal Ultrasounds/Referrals: Normal Fetal Ultrasounds or other Referrals:  None Maternal Substance Abuse:  No Significant Maternal Medications:  Meds include: Other: Metformin 500mg  qAM, 1000mg  qHS Significant Maternal Lab Results: None  No results found for this or any previous visit (from the past 24 hour(s)).  Patient Active Problem List   Diagnosis Date Noted  . Encounter for induction of labor 05/28/2018  . Gestational diabetes mellitus (GDM) in third trimester 03/26/2018  . Rh negative state in antepartum period 03/22/2018  . Elevated BP without diagnosis of hypertension 03/22/2018  . Supervision of high-risk pregnancy, third trimester 12/18/2017  . History of prediabetes 05/04/2016  . PCOS (polycystic ovarian syndrome) 04/03/2016  . Hyperlipidemia 04/03/2016  . Anemia 04/04/2015    Assessment: JEANNETTE DEFAZIO is a 23 y.o. G1P0 at [redacted]w[redacted]d here for IOL for GDM.   #Labor: Induction. Start with Cytotec 50 mg PO x 1@ 1100 and FB placed.  #Pain: IV Pain meds/Epidural upon maternal request #FWB:  Cat I #ID:  GBS neg #MOF: Breast #MOC: None  #Circ:  N/A #GDM: Continue home Metformin. CBG q4 hours  while in latent labor, CBG q2 hrs while in active #Rh Negative: postpartum eval for Rhogam #Elevated intrapartum BP: BP 144/86 on admission. Asymptomatic at this time. Obtain CMP and PCR and continue to monitor.   Kiersten P Mullis 05/28/2018, 11:09 AM  OB FELLOW HISTORY AND PHYSICAL ATTESTATION  I have seen and examined this patient; I agree with above documentation in the resident's note.   Marcy Siren, D.O. OB Fellow  05/28/2018, 11:30 AM

## 2018-05-28 NOTE — Anesthesia Pain Management Evaluation Note (Signed)
  CRNA Pain Management Visit Note  Patient: Joy Wood, 23 y.o., female  "Hello I am a member of the anesthesia team at [facility]. We have an anesthesia team available at all times to provide care throughout the hospital, including epidural management and anesthesia for C-section. I don't know your plan for the delivery whether it a natural birth, water birth, IV sedation, nitrous supplementation, doula or epidural, but we want to meet your pain goals."   1.Was your pain managed to your expectations on prior hospitalizations?   Did not ask  2.What is your expectation for pain management during this hospitalization?     Epidural  3.How can we help you reach that goal? Has received IV pain medicine and is now asking for epidural. RN notified that patient wants epidural now.   Record the patient's initial score and the patient's pain goal.   Pain: 9  Pain Goal: 5 [facility] wants you to be able to say your pain was always managed very well.  Debora Stockdale 05/28/2018

## 2018-05-28 NOTE — Anesthesia Procedure Notes (Signed)
Epidural Patient location during procedure: OB Start time: 05/28/2018 10:52 PM End time: 05/28/2018 11:08 PM  Staffing Anesthesiologist: Lowella Curb, MD Performed: anesthesiologist   Preanesthetic Checklist Completed: patient identified, site marked, surgical consent, pre-op evaluation, timeout performed, IV checked, risks and benefits discussed and monitors and equipment checked  Epidural Patient position: sitting Prep: ChloraPrep Patient monitoring: heart rate, cardiac monitor, continuous pulse ox and blood pressure Approach: midline Location: L2-L3 Injection technique: LOR saline  Needle:  Needle type: Tuohy  Needle gauge: 17 G Needle length: 9 cm Needle insertion depth: 6 cm Catheter type: closed end flexible Catheter size: 20 Guage Catheter at skin depth: 10 cm Test dose: negative  Assessment Events: blood not aspirated, injection not painful, no injection resistance, negative IV test and no paresthesia  Additional Notes Reason for block:procedure for pain

## 2018-05-28 NOTE — Progress Notes (Signed)
LABOR PROGRESS NOTE  Joy Wood is a 23 y.o. G1P0 at [redacted]w[redacted]d  admitted for IOL for GDM.  Subjective: Patient doing well. Tolerating contractions well. Up walking around. Notes back and pelvic pain. Recommended counter pressure and medicine ball. Family at bedside with questions. Spent time answering questions.  Objective: BP 137/82   Pulse 64   Temp 98.6 F (37 C) (Oral)   Resp 18   Ht 5\' 4"  (1.626 m)   Wt 104.3 kg   LMP 08/28/2017 (Exact Date)   BMI 39.48 kg/m  or  Vitals:   05/28/18 1023 05/28/18 1117 05/28/18 1234 05/28/18 1332  BP:  135/83 134/87 137/82  Pulse:  92 78 64  Resp:  18    Temp:      TempSrc:      Weight: 104.3 kg     Height: 5\' 4"  (1.626 m)       Dilation: 2 Effacement (%): 50 Cervical Position: Posterior Station: -1 Presentation: Vertex Exam by:: raney gagnon rnc FHT: baseline rate 120, moderate varibility, + acel, no decel Toco: irregular contractions  Labs: Lab Results  Component Value Date   WBC 10.4 05/28/2018   HGB 10.7 (L) 05/28/2018   HCT 34.9 (L) 05/28/2018   MCV 79.7 (L) 05/28/2018   PLT 260 05/28/2018    Patient Active Problem List   Diagnosis Date Noted  . Encounter for induction of labor 05/28/2018  . Gestational diabetes mellitus (GDM) in third trimester 03/26/2018  . Rh negative state in antepartum period 03/22/2018  . Elevated BP without diagnosis of hypertension 03/22/2018  . Supervision of high-risk pregnancy, third trimester 12/18/2017  . History of prediabetes 05/04/2016  . PCOS (polycystic ovarian syndrome) 04/03/2016  . Hyperlipidemia 04/03/2016  . Anemia 04/04/2015    Assessment / Plan: 23 y.o. G1P0 at [redacted]w[redacted]d here for IOL for GDM.  Labor: FB still in place. Cytotec x 2.  Fetal Wellbeing:  Cat I Pain Control:  Epidural upon request Anticipated MOD:  NSVD #GDM: Continue current plan #Elevated intrapartum BP: Has remained normotensive with SBP in 130's. PIH labs WNL. Asymptomatic. Continue to monitor.    Orpah Cobb, D.O. [facility], PGY1 05/28/2018, 3:08 PM

## 2018-05-28 NOTE — Progress Notes (Addendum)
LABOR PROGRESS NOTE  Joy Wood is a 23 y.o. G1P0 at [redacted]w[redacted]d  admitted for IOL for GDM.  Subjective: Patient sleepy but arousalable during exam. Notes she is comfortable at this time. Family at bedside. Answered questions family had.  Objective: BP 125/74   Pulse 77   Temp 98.6 F (37 C) (Oral)   Resp 18   Ht 5\' 4"  (1.626 m)   Wt 104.3 kg   LMP 08/28/2017 (Exact Date)   BMI 39.48 kg/m  or  Vitals:   05/28/18 1234 05/28/18 1332 05/28/18 1536 05/28/18 1715  BP: 134/87 137/82 127/77 125/74  Pulse: 78 64 93 77  Resp:      Temp:      TempSrc:      Weight:      Height:       Dilation: 2 Effacement (%): 50 Cervical Position: Posterior Station: -1 Presentation: Vertex Exam by:: raney gagnon rnc FHT: baseline rate 130, moderate varibility, + acel, no decel Toco: Irregular  Labs: Lab Results  Component Value Date   WBC 10.4 05/28/2018   HGB 10.7 (L) 05/28/2018   HCT 34.9 (L) 05/28/2018   MCV 79.7 (L) 05/28/2018   PLT 260 05/28/2018    Patient Active Problem List   Diagnosis Date Noted  . Encounter for induction of labor 05/28/2018  . Gestational diabetes mellitus (GDM) in third trimester 03/26/2018  . Rh negative state in antepartum period 03/22/2018  . Elevated BP without diagnosis of hypertension 03/22/2018  . Supervision of high-risk pregnancy, third trimester 12/18/2017  . History of prediabetes 05/04/2016  . PCOS (polycystic ovarian syndrome) 04/03/2016  . Hyperlipidemia 04/03/2016  . Anemia 04/04/2015    Assessment / Plan: 23 y.o. G1P0 at [redacted]w[redacted]d here for IOL for GDM.  Labor: FB still in place. Cytotec x 3 at 2000. Fetal Wellbeing:  Cat I Pain Control:  S/p Stadol and Phenergan x 1. IV pain meds/Epidural upon request Anticipated MOD:  NSVD Elevated BP: BP have remained stable. Continue to monitor.  Orpah Cobb, D.O. [facility], PGY1 05/28/2018, 7:32 PM

## 2018-05-28 NOTE — Anesthesia Preprocedure Evaluation (Signed)
Anesthesia Evaluation  Patient identified by MRN, date of birth, ID band Patient awake    Reviewed: Allergy & Precautions, NPO status , Patient's Chart, lab work & pertinent test results  Airway Mallampati: II  TM Distance: >3 FB Neck ROM: Full    Dental no notable dental hx.    Pulmonary neg pulmonary ROS, former smoker,    Pulmonary exam normal breath sounds clear to auscultation       Cardiovascular negative cardio ROS Normal cardiovascular exam Rhythm:Regular Rate:Normal     Neuro/Psych negative neurological ROS  negative psych ROS   GI/Hepatic negative GI ROS, Neg liver ROS,   Endo/Other  negative endocrine ROSdiabetes  Renal/GU negative Renal ROS  negative genitourinary   Musculoskeletal negative musculoskeletal ROS (+)   Abdominal (+) + obese,   Peds negative pediatric ROS (+)  Hematology negative hematology ROS (+)   Anesthesia Other Findings   Reproductive/Obstetrics (+) Pregnancy                             Anesthesia Physical Anesthesia Plan  ASA: III  Anesthesia Plan: Epidural   Post-op Pain Management:    Induction:   PONV Risk Score and Plan:   Airway Management Planned:   Additional Equipment:   Intra-op Plan:   Post-operative Plan:   Informed Consent:   Plan Discussed with:   Anesthesia Plan Comments:         Anesthesia Quick Evaluation

## 2018-05-29 DIAGNOSIS — O411230 Chorioamnionitis, third trimester, not applicable or unspecified: Secondary | ICD-10-CM

## 2018-05-29 DIAGNOSIS — O24425 Gestational diabetes mellitus in childbirth, controlled by oral hypoglycemic drugs: Secondary | ICD-10-CM

## 2018-05-29 DIAGNOSIS — Z3A39 39 weeks gestation of pregnancy: Secondary | ICD-10-CM

## 2018-05-29 LAB — GLUCOSE, CAPILLARY
Glucose-Capillary: 100 mg/dL — ABNORMAL HIGH (ref 70–99)
Glucose-Capillary: 120 mg/dL — ABNORMAL HIGH (ref 70–99)
Glucose-Capillary: 65 mg/dL — ABNORMAL LOW (ref 70–99)
Glucose-Capillary: 69 mg/dL — ABNORMAL LOW (ref 70–99)
Glucose-Capillary: 87 mg/dL (ref 70–99)
Glucose-Capillary: 92 mg/dL (ref 70–99)

## 2018-05-29 LAB — TYPE AND SCREEN
ABO/RH(D): A NEG
Antibody Screen: NEGATIVE

## 2018-05-29 LAB — RPR: RPR: NONREACTIVE

## 2018-05-29 LAB — ABO/RH: ABO/RH(D): A NEG

## 2018-05-29 MED ORDER — SENNOSIDES-DOCUSATE SODIUM 8.6-50 MG PO TABS
2.0000 | ORAL_TABLET | ORAL | Status: DC
  Administered 2018-05-30 (×2): 2 via ORAL
  Filled 2018-05-29 (×2): qty 2

## 2018-05-29 MED ORDER — WITCH HAZEL-GLYCERIN EX PADS: 1.0000 "application " | MEDICATED_PAD | CUTANEOUS | Status: DC | PRN

## 2018-05-29 MED ORDER — TETANUS-DIPHTH-ACELL PERTUSSIS 5-2.5-18.5 LF-MCG/0.5 IM SUSP: 0.5000 mL | Freq: Once | INTRAMUSCULAR | Status: DC

## 2018-05-29 MED ORDER — PRENATAL MULTIVITAMIN CH
1.0000 | ORAL_TABLET | Freq: Every day | ORAL | Status: DC
  Administered 2018-05-30: 1 via ORAL
  Filled 2018-05-29: qty 1

## 2018-05-29 MED ORDER — ONDANSETRON HCL 4 MG PO TABS
4.0000 mg | ORAL_TABLET | ORAL | Status: DC | PRN
  Administered 2018-05-31: 4 mg via ORAL
  Filled 2018-05-29: qty 1

## 2018-05-29 MED ORDER — GENTAMICIN SULFATE 40 MG/ML IJ SOLN
5.0000 mg/kg | Freq: Once | INTRAVENOUS | Status: AC
  Administered 2018-05-29: 370 mg via INTRAVENOUS
  Filled 2018-05-29: qty 9.25

## 2018-05-29 MED ORDER — IBUPROFEN 600 MG PO TABS
600.0000 mg | ORAL_TABLET | Freq: Four times a day (QID) | ORAL | Status: DC
  Administered 2018-05-29 – 2018-05-31 (×8): 600 mg via ORAL
  Filled 2018-05-29 (×8): qty 1

## 2018-05-29 MED ORDER — COCONUT OIL OIL: 1.0000 "application " | TOPICAL_OIL | Status: DC | PRN

## 2018-05-29 MED ORDER — MEASLES, MUMPS & RUBELLA VAC IJ SOLR: 0.5000 mL | Freq: Once | INTRAMUSCULAR | Status: DC

## 2018-05-29 MED ORDER — ONDANSETRON HCL 4 MG/2ML IJ SOLN: 4.0000 mg | INTRAMUSCULAR | Status: DC | PRN

## 2018-05-29 MED ORDER — SODIUM CHLORIDE 0.9 % IV SOLN
2.0000 g | Freq: Once | INTRAVENOUS | Status: AC
  Administered 2018-05-29: 2 g via INTRAVENOUS
  Filled 2018-05-29: qty 2000

## 2018-05-29 MED ORDER — SIMETHICONE 80 MG PO CHEW: 80.0000 mg | CHEWABLE_TABLET | ORAL | Status: DC | PRN

## 2018-05-29 MED ORDER — DIPHENHYDRAMINE HCL 25 MG PO CAPS: 25.0000 mg | ORAL_CAPSULE | Freq: Four times a day (QID) | ORAL | Status: DC | PRN

## 2018-05-29 MED ORDER — ZOLPIDEM TARTRATE 5 MG PO TABS: 5.0000 mg | ORAL_TABLET | Freq: Every evening | ORAL | Status: DC | PRN

## 2018-05-29 MED ORDER — DIBUCAINE 1 % RE OINT: 1.0000 "application " | TOPICAL_OINTMENT | RECTAL | Status: DC | PRN

## 2018-05-29 MED ORDER — ACETAMINOPHEN 325 MG PO TABS
650.0000 mg | ORAL_TABLET | ORAL | Status: DC | PRN
  Administered 2018-05-30: 650 mg via ORAL
  Filled 2018-05-29: qty 2

## 2018-05-29 MED ORDER — ACETAMINOPHEN 500 MG PO TABS
1000.0000 mg | ORAL_TABLET | Freq: Once | ORAL | Status: AC
  Administered 2018-05-29: 1000 mg via ORAL
  Filled 2018-05-29: qty 2

## 2018-05-29 MED ORDER — BENZOCAINE-MENTHOL 20-0.5 % EX AERO
1.0000 "application " | INHALATION_SPRAY | CUTANEOUS | Status: DC | PRN
  Administered 2018-05-29 – 2018-05-31 (×3): 1 via TOPICAL
  Filled 2018-05-29 (×3): qty 56

## 2018-05-29 NOTE — Progress Notes (Signed)
Patient ID: Joy Wood, female   DOB: 07/16/1995, 23 y.o.   MRN: [id number] Joy Wood is a 23 y.o. G1P0 at [redacted]w[redacted]d admitted for induction of labor due to A2DM.  Subjective: Comfortable w/ epidural, no complaints  Objective: BP (!) 114/59   Pulse 74   Temp 99.3 F (37.4 C) (Oral)   Resp 16   Ht 5\' 4"  (1.626 m)   Wt 104.3 kg   LMP 08/28/2017 (Exact Date)   SpO2 100%   BMI 39.48 kg/m  No intake/output data recorded.  FHT:  FHR: 145 bpm, variability: moderate,  accelerations:  Present,  decelerations:  Absent UC:   q 2-79mins  SVE:   Dilation: 6 Effacement (%): 90 Station: -2 Exam by:: Joy Wood  CNM  Pitocin @ 8 mu/min  Labs: Lab Results  Component Value Date   WBC 10.4 05/28/2018   HGB 10.7 (L) 05/28/2018   HCT 34.9 (L) 05/28/2018   MCV 79.7 (L) 05/28/2018   PLT 260 05/28/2018    Assessment / Plan: IOL d/t A2DM, s/p cytotec, foley bulb, now on pitocin & arom'd  Labor: Progressing normally Fetal Wellbeing:  Category I Pain Control:  Epidural Pre-eclampsia: bp's normal since 2300, labile before then I/D:  n/a Anticipated MOD:  NSVD  Cheral Marker CNM, WHNP-BC 05/29/2018, 1:50 AM

## 2018-05-29 NOTE — Progress Notes (Signed)
Pharmacy Antibiotic Note  Joy Wood is a 23 y.o. female admitted on 05/28/2018 for IOL.  Pharmacy has been consulted for gentamicin dosing for suspected chorioamnionitis.   Plan: gentamicin 5mg /kg IV x 1 If antibiotics continued postpartum, plan to give same dose Q24 hours.   Height: 5\' 4"  (162.6 cm) Weight: 230 lb (104.3 kg) IBW/kg (Calculated) : 54.7  Temp (24hrs), Avg:100.1 F (37.8 C), Min:98.6 F (37 C), Max:102.4 F (39.1 C)  Recent Labs  Lab 05/28/18 1005  WBC 10.4  CREATININE 0.60    Estimated Creatinine Clearance: 128.6 mL/min (by C-G formula based on SCr of 0.6 mg/dL).    No Known Allergies  Antimicrobials this admission: Ampicillin 2 gram x 1   Thank you for allowing pharmacy to be a part of this patient's care.  Loyola Mast 05/29/2018 6:22 AM

## 2018-05-29 NOTE — Lactation Note (Signed)
This note was copied from a baby's chart. Lactation Consultation Note  Patient Name: Joy Wood Date: 05/29/2018 Reason for consult: Follow-up assessment;Term;Primapara;1st time breastfeeding;Mother's request  P1 mother whose infant is now 68 hours old.   Mother requested latch assistance.  She has been unable to get baby to latch.  Baby was fussy when I arrived.  Attempted to calm her with my gloved finger.  Her suck is uncoordinated at this time.  She required cheek and jaw support to effectively suck on my finger.  Offered to assist with latching and mother accepted.  Mother was able to express a couple drops of colostrum which I finger fed to baby.  Assisted to latch in the cross cradle hold on the right breast without success.  Baby would get to the breast and immediately cry and fuss pulling her head back.  Attempted to burp her and tried again with the same result.  Burped her and attempted to latch to the left breast in the football hold.  Baby latched but showed no interest in sucking.  Since baby remains fussy I suggested mother begin pumping with the DEBP to avoid giving formula.  Mother agreed.  Initiated the DEBP.  Reviewed pump parts, assembly, disassembly and cleaning.  Mother feels comfortable with pumping and #24 flange size appropriate at this time.  Visitors in to help calm baby and I swaddled tightly.  Baby was calm and mother will feed back any EBM she obtains from pumping.  She will call for latch assistance as needed.   Maternal Data Formula Feeding for Exclusion: No Has patient been taught Hand Expression?: Yes Does the patient have breastfeeding experience prior to this delivery?: No  Feeding Feeding Type: Breast Fed  LATCH Score Latch: Repeated attempts needed to sustain latch, nipple held in mouth throughout feeding, stimulation needed to elicit sucking reflex.  Audible Swallowing: None  Type of Nipple: Everted at rest and after  stimulation  Comfort (Breast/Nipple): Soft / non-tender  Hold (Positioning): Assistance needed to correctly position infant at breast and maintain latch.  LATCH Score: 6  Interventions Interventions: Breast feeding basics reviewed;Assisted with latch;Skin to skin;Breast massage;Hand express;Breast compression;Hand pump;Expressed milk;Position options;Support pillows;Adjust position;DEBP  Lactation Tools Discussed/Used WIC Program: Yes Pump Review: Setup, frequency, and cleaning;Milk Storage Initiated by:: Vittorio Mohs Date initiated:: 05/29/18   Consult Status Consult Status: Follow-up Date: 05/30/18 Follow-up type: In-patient    Dora Sims 05/29/2018, 8:13 PM

## 2018-05-29 NOTE — Progress Notes (Signed)
Patient ID: Joy Wood, female   DOB: 21-Apr-1995, 23 y.o.   MRN: [id number] ARLYNNE Wood is a 23 y.o. G1P0 at 101w1d admitted for induction of labor due to A2DM.  Subjective: Feeling pressure  Objective: BP (!) 121/51   Pulse 92   Temp (!) 102.2 F (39 C) (Axillary)   Resp 16   Ht 5\' 4"  (1.626 m)   Wt 104.3 kg   LMP 08/28/2017 (Exact Date)   SpO2 100%   BMI 39.48 kg/m  No intake/output data recorded.  FHT:  FHR: 175 bpm, variability: min-mod,  accelerations:  Present,  decelerations:  Absent UC:   q 2-95mins  SVE:   Dilation: 9 Effacement (%): 90 Station: -1 Exam by:: Cyprus McHenry RN  Pitocin @ 10 mu/min  Labs: Lab Results  Component Value Date   WBC 10.4 05/28/2018   HGB 10.7 (L) 05/28/2018   HCT 34.9 (L) 05/28/2018   MCV 79.7 (L) 05/28/2018   PLT 260 05/28/2018    Assessment / Plan: IOL d/t A2DM, now w/ Triple I, amp/gent ordered, apap 1gm po for fever, now 9cm, anticipate NSVB  Labor: Progressing normally Fetal Wellbeing:  Category II Pain Control:  Epidural Pre-eclampsia: n/a I/D:  Amp/gent Anticipated MOD:  NSVD  Cheral Marker CNM, WHNP-BC 05/29/2018, 6:52 AM

## 2018-05-29 NOTE — Progress Notes (Signed)
Patient ID: Joy Wood, female   DOB: 09-25-1995, 23 y.o.   MRN: [id number] WHITNEI BODE is a 23 y.o. G1P0 at [redacted]w[redacted]d admitted for induction of labor due to A2DM.  Subjective: Comfortable, no complaints  Objective: BP 109/61   Pulse 75   Temp 99.3 F (37.4 C) (Oral)   Resp 16   Ht 5\' 4"  (1.626 m)   Wt 104.3 kg   LMP 08/28/2017 (Exact Date)   SpO2 100%   BMI 39.48 kg/m  No intake/output data recorded.  FHT:  FHR: 135 bpm, variability: moderate,  accelerations:  Present,  decelerations:  Absent UC:   irregular  SVE:   Dilation: 4.5 Effacement (%): 70 Station: -1 Exam by:: Cyprus McHenry RN  Foley bulb now out  Labs: Lab Results  Component Value Date   WBC 10.4 05/28/2018   HGB 10.7 (L) 05/28/2018   HCT 34.9 (L) 05/28/2018   MCV 79.7 (L) 05/28/2018   PLT 260 05/28/2018   CBG (last 3)  Recent Labs    05/28/18 1132 05/28/18 1532 05/28/18 2025  GLUCAP 85 83 71     Assessment / Plan: IOL d/t A2DM, foley bulb out, will start pitocin per protocol  Labor: s/p cervical ripening Fetal Wellbeing:  Category I Pain Control:  n/a Pre-eclampsia: labile bp's, stable, labs normal I/D:  gbs neg Anticipated MOD:  NSVD  Cheral Marker CNM, [facility] 05/28/2018 @ 2130

## 2018-05-30 ENCOUNTER — Other Ambulatory Visit: Payer: Self-pay

## 2018-05-30 LAB — GLUCOSE, CAPILLARY: Glucose-Capillary: 117 mg/dL — ABNORMAL HIGH (ref 70–99)

## 2018-05-30 MED ORDER — RHO D IMMUNE GLOBULIN 1500 UNIT/2ML IJ SOSY
300.0000 ug | PREFILLED_SYRINGE | Freq: Once | INTRAMUSCULAR | Status: AC
  Administered 2018-05-30: 300 ug via INTRAVENOUS
  Filled 2018-05-30: qty 2

## 2018-05-30 NOTE — Progress Notes (Signed)
POSTPARTUM PROGRESS NOTE  Post Partum Day 1  Subjective:  Joy Wood is a 23 y.o. G1P0 s/p SVD at [redacted]w[redacted]d.  She reports she is doing well. No acute events overnight. She denies any problems with ambulating, voiding or po intake. Denies nausea or vomiting.  Pain is well controlled.  Lochia is mild.  Objective: Blood pressure 119/61, pulse 83, temperature 98.2 F (36.8 C), temperature source Oral, resp. rate 18, height 5\' 4"  (1.626 m), weight 104.3 kg, last menstrual period 08/28/2017, SpO2 99 %.  Physical Exam:  General: alert, cooperative and no distress Chest: no respiratory distress Heart:regular rate, distal pulses intact Abdomen: soft, nontender,  Uterine Fundus: firm, appropriately tender DVT Evaluation: No calf swelling or tenderness Extremities: trace LE edema Skin: warm, dry  Recent Labs    05/28/18 1005  HGB 10.7*  HCT 34.9*    Assessment/Plan: Joy Wood is a 23 y.o. G1P0 s/p SVD at [redacted]w[redacted]d   PPD#1 - Doing well  Routine postpartum care H/o A2GDM: CBG's stable 71-120. Continue to monitor Rh incompatibility: Patient to receive 2nd dose of Rhogam prior to discharge H/o Intrapartum HTN: post partum blood pressures stable and normotensive. Continue to monitor.  Contraception: Unsure Feeding: Breast Dispo: Plan for discharge PPD#2.   LOS: 2 days   Orpah Cobb, D.O.  [facility], PGY1 05/30/2018, 12:01 PM

## 2018-05-30 NOTE — Discharge Summary (Signed)
Postpartum Discharge Summary    Patient Name: Joy Wood DOB: 1995/07/10 MRN: [id number]  Date of admission: 05/28/2018 Delivering Provider: Arvilla Market   Date of discharge: 05/31/2018  Admitting diagnosis: 21WKS DEHYDRATION, STOMACH BUG Intrauterine pregnancy: 109w2d     Secondary diagnosis:  Active Problems:   PCOS (polycystic ovarian syndrome)   Rh negative state in antepartum period   Elevated BP without diagnosis of hypertension   Gestational diabetes mellitus (GDM) in third trimester   Encounter for induction of labor  Additional problems: A2GDM, Iron deficiency anemia,      Discharge diagnosis: Term Pregnancy Delivered, GDM A2 and Anemia                          Post partum procedures:rhogam  Augmentation: AROM, Pitocin, Cytotec and Foley Balloon  Complications: Triple I  intrapartum labile BP  (normal PIH labs)  Hospital course:  Induction of Labor With Vaginal Delivery   23 y.o. yo G1P0 at [redacted]w[redacted]d was admitted to the hospital 05/28/2018 for induction of labor.  Indication for induction: A2 DM.  Patient had a labor course remarkable for having cx ripening with the usual measures. She had occasional labile BPs with nl labs. At 9cm she became febrile and was tx with Amp and Gent for Triple I. Membrane Rupture Time/Date: 1:48 AM ,05/29/2018   Intrapartum Procedures: Episiotomy: None [1]                                         Lacerations:  2nd degree [3]  Patient had delivery of a Viable infant.  Information for the patient's newborn:  [place], [place] [[id number]]  Delivery Method: Vaginal, Spontaneous(Filed from Delivery Summary)   05/29/2018  Details of delivery can be found in separate delivery note.  Patient had a routine postpartum course. Patient is discharged home 05/31/18.  Magnesium Sulfate recieved: No BMZ received: No  Physical exam  Vitals:   05/30/18 0530 05/30/18 1559 05/30/18 2238 05/31/18 0645  BP: 119/61 119/77 125/75 119/73  Pulse:  83 85 75 80  Resp: 18 20 15 18   Temp: 98.2 F (36.8 C) 98.4 F (36.9 C) 98.6 F (37 C) 97.8 F (36.6 C)  TempSrc: Oral Oral  Oral  SpO2: 99%  100% 100%  Weight:      Height:       General: alert, well-appearing, NAD Lochia: appropriate Uterine Fundus: firm Incision: N/A DVT Evaluation: No significant calf/ankle edema. Labs: Lab Results  Component Value Date   WBC 10.4 05/28/2018   HGB 10.7 (L) 05/28/2018   HCT 34.9 (L) 05/28/2018   MCV 79.7 (L) 05/28/2018   PLT 260 05/28/2018   CMP Latest Ref Rng & Units 05/28/2018  Glucose 70 - 99 mg/dL 88  BUN 6 - 20 mg/dL 9  Creatinine 4.09 - 7.35 mg/dL 3.29  Sodium 924 - 268 mmol/L 134(L)  Potassium 3.5 - 5.1 mmol/L 4.1  Chloride 98 - 111 mmol/L 107  CO2 22 - 32 mmol/L 18(L)  Calcium 8.9 - 10.3 mg/dL 9.3  Total Protein 6.5 - 8.1 g/dL 3.4(H)  Total Bilirubin 0.3 - 1.2 mg/dL 0.5  Alkaline Phos 38 - 126 U/L 100  AST 15 - 41 U/L 26  ALT 0 - 44 U/L 20    Discharge instruction: per After Visit Summary and "Baby and Me Booklet".  After visit meds:  Allergies as of 05/31/2018   No Known Allergies     Medication List    STOP taking these medications   ACCU-CHEK FASTCLIX LANCETS Misc   glucose blood test strip Commonly known as:  ACCU-CHEK GUIDE   metFORMIN 500 MG 24 hr tablet Commonly known as:  GLUCOPHAGE-XR     TAKE these medications   diphenhydrAMINE 25 MG tablet Commonly known as:  BENADRYL Take 25 mg by mouth at bedtime as needed.   famotidine 20 MG tablet Commonly known as:  PEPCID Take 20 mg by mouth daily.   ibuprofen 800 MG tablet Commonly known as:  ADVIL,MOTRIN Take 1 tablet (800 mg total) by mouth 3 (three) times daily.   prenatal multivitamin Tabs tablet Take 1 tablet by mouth daily at 12 noon.   senna-docusate 8.6-50 MG tablet Commonly known as:  Senokot-S Take 2 tablets by mouth at bedtime as needed for mild constipation.       Diet: routine diet Activity: Advance as tolerated. Pelvic rest  for 6 weeks.   Follow up Appt with GTT: Future Appointments  Date Time Provider Department Center  06/05/2018 10:00 AM [facility] NURSE [facility]  07/10/2018  9:30 AM [facility] LAB [facility]  07/10/2018 10:15 AM Magnus Sinning Dimas [place], PA-C [facility]OC   Follow up Visit: Follow-up Information    [facility] Schedule an appointment as soon as possible for a visit.   Specialty:  Obstetrics and Gynecology Why:  You should receive a call to schedule a postpartum follow-up. If you do not, please call clinic in 4-6 Contact information: [address] [place] [address] [phone]         Newborn Data: Live born female  Birth Weight: 8 lb 9.2 oz (3890 g) APGAR: 8, 9  Newborn Delivery   Birth date/time:  05/29/2018 11:49:00 Delivery type:  Vaginal, Spontaneous    Baby Feeding: Breast Disposition:home with mother  05/31/2018 Arabella Merles, CNM  10:27 AM

## 2018-05-30 NOTE — Anesthesia Postprocedure Evaluation (Signed)
Anesthesia Post Note  Patient: Joy Wood  Procedure(s) Performed: AN AD HOC LABOR EPIDURAL     Patient location during evaluation: Mother Baby Anesthesia Type: Epidural Level of consciousness: awake Pain management: satisfactory to patient Vital Signs Assessment: post-procedure vital signs reviewed and stable Respiratory status: spontaneous breathing Cardiovascular status: stable Anesthetic complications: no    Last Vitals:  Vitals:   05/30/18 0100 05/30/18 0530  BP: 110/72 119/61  Pulse: 74 83  Resp: 18 18  Temp: 36.9 C 36.8 C  SpO2: 100% 99%    Last Pain:  Vitals:   05/30/18 0530  TempSrc: Oral  PainSc: 5    Pain Goal: Patients Stated Pain Goal: 4 (05/28/18 1020)                 Cephus Shelling

## 2018-05-30 NOTE — Progress Notes (Signed)
Post Partum Day 1 Subjective: no complaints, up ad lib, voiding and tolerating PO  Objective: Blood pressure 119/77, pulse 85, temperature 98.4 F (36.9 C), temperature source Oral, resp. rate 20, height 5\' 4"  (1.626 m), weight 104.3 kg, last menstrual period 08/28/2017, SpO2 99 %.  Physical Exam:  General: alert, cooperative and no distress Lochia: appropriate Uterine Fundus: firm Incision: n/a DVT Evaluation: No evidence of DVT seen on physical exam.  Recent Labs    05/28/18 1005  HGB 10.7*  HCT 34.9*    Assessment/Plan: Plan for discharge tomorrow, Breastfeeding and Contraception none   LOS: 2 days   Sharen Counter 05/30/2018, 8:43 PM

## 2018-05-30 NOTE — Progress Notes (Signed)
CSW received consult for hx of Anxiety. CSW met with MOB to offer support and complete assessment.    MOB cradling baby in bed when CSW entered the room. CSW introduced self and reason for consult. MOB denied having a mental health history beyond "some anxiety" during the pregnancy. MOB attributed her anxiety to this being her first pregnancy and not knowing what to expect. MOB denied any current mental health symptoms and reported feeling good. MOB denied any SI/HI or DV in the home. MOB listed her friends, FOB and family as supports if she needed them.   CSW provided education regarding the baby blues period vs. perinatal mood disorders, discussed treatment and gave resources for mental health follow up if concerns arise.  CSW recommends self-evaluation during the postpartum time period using the New Mom Checklist from Postpartum Progress and encouraged MOB to contact a medical professional if symptoms are noted at any time.    CSW provided review of Sudden Infant Death Syndrome (SIDS) precautions.    CSW identifies no further need for intervention and no barriers to discharge at this time.  Aurora Rody Irwin, LCSWA  Clinical Social Work Department  [phone]  

## 2018-05-30 NOTE — Lactation Note (Addendum)
This note was copied from a baby's chart. Lactation Consultation Note  Patient Name: Joy Wood [id number] Date: 05/30/2018  Mom had questions regarding a herbal supplement that she purchased to take for cramps called After Ease by [employer]. Recommended by Midwives per bottle. This LC hadn't heard of this supplement, there was no information in the [facility] book. Also mom asked about smoking a Hookah. LC discouraged any type of smoking while BF. Baby had been spitting up brown sputum.  Discussed newborn feeding habits, STS, I&O, and demonstrated hand expression w/glisening of colostrum.  Mom has approximately 3 fingers width space between breast slightly tubular shaped. Mom does have breast tissue to outer aspect of breast towards axillary area.  Nipple at bottom end of areola. Mom stated her areola got bigger and a lot darker, but not much increase in breast size.  Mom stated she was going to send the baby to the nursery since she was spitting up and wanted her watched while she was sleeping so baby wouldn't choke. LC thought mom had been seen previously but hasn't. No pamphlets given to mom at this time. Mom stated she was very tired. Encouraged mom to call for assistance.  LC to f/u in am.     Maternal Data    Feeding Feeding Type: Breast Fed  LATCH Score                   Interventions    Lactation Tools Discussed/Used     Consult Status       Santiaga Butzin G 05/30/2018, 12:34 AM

## 2018-05-30 NOTE — Discharge Instructions (Signed)
Vaginal Delivery, Care After °Refer to this sheet in the next few weeks. These instructions provide you with information about caring for yourself after vaginal delivery. Your health care provider may also give you more specific instructions. Your treatment has been planned according to current medical practices, but problems sometimes occur. Call your health care provider if you have any problems or questions. °What can I expect after the procedure? °After vaginal delivery, it is common to have: °· Some bleeding from your vagina. °· Soreness in your abdomen, your vagina, and the area of skin between your vaginal opening and your anus (perineum). °· Pelvic cramps. °· Fatigue. °Follow these instructions at home: °Medicines °· Take over-the-counter and prescription medicines only as told by your health care provider. °· If you were prescribed an antibiotic medicine, take it as told by your health care provider. Do not stop taking the antibiotic until it is finished. °Driving ° °· Do not drive or operate heavy machinery while taking prescription pain medicine. °· Do not drive for 24 hours if you received a sedative. °Lifestyle °· Do not drink alcohol. This is especially important if you are breastfeeding or taking medicine to relieve pain. °· Do not use tobacco products, including cigarettes, chewing tobacco, or e-cigarettes. If you need help quitting, ask your health care provider. °Eating and drinking °· Drink at least 8 eight-ounce glasses of water every day unless you are told not to by your health care provider. If you choose to breastfeed your baby, you may need to drink more water than this. °· Eat high-fiber foods every day. These foods may help prevent or relieve constipation. High-fiber foods include: °? Whole grain cereals and breads. °? Brown rice. °? Beans. °? Fresh fruits and vegetables. °Activity °· Return to your normal activities as told by your health care provider. Ask your health care provider what  activities are safe for you. °· Rest as much as possible. Try to rest or take a nap when your baby is sleeping. °· Do not lift anything that is heavier than your baby or 10 lb (4.5 kg) until your health care provider says that it is safe. °· Talk with your health care provider about when you can engage in sexual activity. This may depend on your: °? Risk of infection. °? Rate of healing. °? Comfort and desire to engage in sexual activity. °Vaginal Care °· If you have an episiotomy or a vaginal tear, check the area every day for signs of infection. Check for: °? More redness, swelling, or pain. °? More fluid or blood. °? Warmth. °? Pus or a bad smell. °· Do not use tampons or douches until your health care provider says this is safe. °· Watch for any blood clots that may pass from your vagina. These may look like clumps of dark red, brown, or black discharge. °General instructions °· Keep your perineum clean and dry as told by your health care provider. °· Wear loose, comfortable clothing. °· Wipe from front to back when you use the toilet. °· Ask your health care provider if you can shower or take a bath. If you had an episiotomy or a perineal tear during labor and delivery, your health care provider may tell you not to take baths for a certain length of time. °· Wear a bra that supports your breasts and fits you well. °· If possible, have someone help you with household activities and help care for your baby for at least a few days after you   leave the hospital. °· Keep all follow-up visits for you and your baby as told by your health care provider. This is important. °Contact a health care provider if: °· You have: °? Vaginal discharge that has a bad smell. °? Difficulty urinating. °? Pain when urinating. °? A sudden increase or decrease in the frequency of your bowel movements. °? More redness, swelling, or pain around your episiotomy or vaginal tear. °? More fluid or blood coming from your episiotomy or vaginal  tear. °? Pus or a bad smell coming from your episiotomy or vaginal tear. °? A fever. °? A rash. °? Little or no interest in activities you used to enjoy. °? Questions about caring for yourself or your baby. °· Your episiotomy or vaginal tear feels warm to the touch. °· Your episiotomy or vaginal tear is separating or does not appear to be healing. °· Your breasts are painful, hard, or turn red. °· You feel unusually sad or worried. °· You feel nauseous or you vomit. °· You pass large blood clots from your vagina. If you pass a blood clot from your vagina, save it to show to your health care provider. Do not flush blood clots down the toilet without having your health care provider look at them. °· You urinate more than usual. °· You are dizzy or light-headed. °· You have not breastfed at all and you have not had a menstrual period for 12 weeks after delivery. °· You have stopped breastfeeding and you have not had a menstrual period for 12 weeks after you stopped breastfeeding. °Get help right away if: °· You have: °? Pain that does not go away or does not get better with medicine. °? Chest pain. °? Difficulty breathing. °? Blurred vision or spots in your vision. °? Thoughts about hurting yourself or your baby. °· You develop pain in your abdomen or in one of your legs. °· You develop a severe headache. °· You faint. °· You bleed from your vagina so much that you fill two sanitary pads in one hour. °This information is not intended to replace advice given to you by your health care provider. Make sure you discuss any questions you have with your health care provider. °Document Released: 03/17/2000 Document Revised: 09/01/2015 Document Reviewed: 04/04/2015 °Elsevier Interactive Patient Education © 2019 [employer] ° °

## 2018-05-31 LAB — RH IG WORKUP (INCLUDES ABO/RH)
ABO/RH(D): A NEG
Fetal Screen: NEGATIVE
Gestational Age(Wks): 39
Unit division: 0

## 2018-05-31 MED ORDER — IBUPROFEN 800 MG PO TABS: 800.0000 mg | ORAL_TABLET | Freq: Three times a day (TID) | ORAL | 0 refills | Status: DC

## 2018-05-31 MED ORDER — SENNOSIDES-DOCUSATE SODIUM 8.6-50 MG PO TABS: 2.0000 | ORAL_TABLET | Freq: Every evening | ORAL | 0 refills | Status: DC | PRN

## 2018-05-31 NOTE — Lactation Note (Signed)
This note was copied from a baby's chart. Lactation Consultation Note  Patient Name: Joy Wood [id number] Date: 05/31/2018 Reason for consult: Follow-up assessment   P1, Baby 50 hours old.  Per mother she was overwhelmed last night with cluster feeding and started giving formula.  Mother states she wants to breastfeed and formula feed. Discussed supply and demand. Recommend mother breastfeeding before offering formula or breastfeed with 5 french. Observed breastfeeding on R side in football hold. Placed 5 french feeding tube in baby's mouth after latching on L side and baby took 22 ml. Reviewed cleaning of 5 french feeding tube. Reviewed volume guidelines increasing per day of life and as baby desires. Feed on demand approximately 8-12 times per day.   Reviewed engorgement care and monitoring voids/stools. Recommend post pumping.    Maternal Data    Feeding Feeding Type: Breast Fed  LATCH Score Latch: Grasps breast easily, tongue down, lips flanged, rhythmical sucking.  Audible Swallowing: A few with stimulation  Type of Nipple: Everted at rest and after stimulation  Comfort (Breast/Nipple): Soft / non-tender  Hold (Positioning): Assistance needed to correctly position infant at breast and maintain latch.  LATCH Score: 8  Interventions Interventions: Breast feeding basics reviewed;Hand express  Lactation Tools Discussed/Used     Consult Status Consult Status: Follow-up Date: 06/01/18 Follow-up type: In-patient    Dahlia Byes [facility] 05/31/2018, 2:27 PM

## 2018-05-31 NOTE — Lactation Note (Signed)
This note was copied from a baby's chart. Lactation Consultation Note Mom requested assistance from [facility] d/t baby will ot latch, very fussy. When LC entered rm. Mom holding baby w/pacifier in mouth. Mom stated that the baby hasn't wanted to latch and hasn't fed since 2200. Discouraged pacifiers for 2 weeks. LC latched baby in football hold. Baby BF well for a few minutes then cries at intervals. Removed baby burp baby, check diaper. Baby had void, dark w/ uric crystals. Baby's lips dry. Appears hungry. Noted when baby cries tongue cups, unable to raise tongue. W/gloved finger assessed suck, noted weak, high palate, and chomps. Appears baby has tight labial frenulum and tongue tie. Discussed w/parents. Fitted mom w/#20 NS mom demonstrated application. Taught "C" hold for latching. Baby on breast off and on, crying. Asked RN to change and loosen HUGS tag to check for comfort. Woke FOB to hold baby and burp while LC works w/mom hand expressing and pumping. Used hand pump to start flow. Set up DEBP for after feeding d/t using NS. Mom is very sleepy and abd. Cramping. States she will have to pump later after rested. Didn't demonstrate to mom how to work pump. Mom frustrated and tired. LC collected 0.13ml colostrum w/curve tip syring inserted into NS. Baby finally calms down, BF well and sleeps off and on.  Discussed cluster feeding w/mom.   Patient Name: Joy Wood [id number] Date: 05/31/2018 Reason for consult: Mother's request;Difficult latch;Maternal endocrine disorder Type of Endocrine Disorder?: Diabetes(PCOS)   Maternal Data    Feeding Feeding Type: Breast Fed  LATCH Score Latch: Repeated attempts needed to sustain latch, nipple held in mouth throughout feeding, stimulation needed to elicit sucking reflex.  Audible Swallowing: A few with stimulation  Type of Nipple: Everted at rest and after stimulation  Comfort (Breast/Nipple): Filling, red/small blisters or bruises, mild/mod  discomfort  Hold (Positioning): Assistance needed to correctly position infant at breast and maintain latch.  LATCH Score: 6  Interventions Interventions: Breast feeding basics reviewed;Adjust position;DEBP;Assisted with latch;Support pillows;Skin to skin;Position options;Breast massage;Expressed milk;Hand express;Pre-pump if needed;Breast compression;Hand pump  Lactation Tools Discussed/Used Tools: Nipple [name];Pump Nipple shield size: 20 Breast pump type: Double-Electric Breast Pump;Manual Pump Review: Setup, frequency, and cleaning;Milk Storage Initiated by:: Peri Jefferson RN IBCLC Date initiated:: 05/31/18   Consult Status Consult Status: Follow-up Date: 05/31/18 Follow-up type: In-patient    Charyl Dancer 05/31/2018, 3:18 AM

## 2018-06-03 ENCOUNTER — Telehealth: Payer: Self-pay | Admitting: Family Medicine

## 2018-06-03 NOTE — Telephone Encounter (Signed)
Patient was told at the hospital there would be a nurse coming to her house to get her BP. I informed her that if a nurse came to her house, she did not need to keep the nurse visit scheduled for her on the 4th at 10:00 am. Patient stated she understood.

## 2018-06-05 ENCOUNTER — Ambulatory Visit: Payer: Medicaid Other

## 2018-06-06 ENCOUNTER — Telehealth: Payer: Self-pay | Admitting: *Deleted

## 2018-06-06 ENCOUNTER — Telehealth: Payer: Self-pay | Admitting: Obstetrics and Gynecology

## 2018-06-06 NOTE — Telephone Encounter (Signed)
Patient called to make an appointment with the at home nurse. I explained to her we don't make appointments for that. However, I looked in her chart and explained to her that per Dr Alysia Penna she should come in a week to get her blood pressure checked. Patient was adamant that she did not need to come in, because the house nurse stated she was coming to see her. Patient stated she would call the nurse.

## 2018-06-06 NOTE — Telephone Encounter (Signed)
Received a voicemail from this am from [place] - nurse with [facility] - states did home visit- pt. Delivered 05/29/18 and BP is 146/94 . States she is not symptomatic- no increase in swelling, no headache currently but had headache 2 nights that relieved with tylenol and rest.    Discussed with Dr.Ervin and recommended blood pressure check in one week.   I called [facility] and left a message we are calling because we received a message from home visit nurse and want you to call us to schedule blood pressure check in one week, will also send yu MyChart message.

## 2018-06-10 ENCOUNTER — Telehealth: Payer: Self-pay | Admitting: *Deleted

## 2018-06-10 NOTE — Telephone Encounter (Signed)
Received a voicemail from nurse Coralie Common with [facility] that she is calling with another blood pressure reading. States today it was 132/ 86 and patient is not having any symptoms.  States patient concerned because she saw chart saws missed appointment but states she didn't miss one.  Shate also sent MyChart message which I responded to her telling her she was scheduled for a blood pressure visit in the office as well as home visit; but does not need  To reschedule unless we call her back. Will forward bp reading to provider.

## 2018-06-24 ENCOUNTER — Encounter: Payer: Self-pay | Admitting: *Deleted

## 2018-06-25 ENCOUNTER — Encounter: Payer: Self-pay | Admitting: *Deleted

## 2018-06-26 ENCOUNTER — Ambulatory Visit: Payer: Medicaid Other | Admitting: Nurse Practitioner

## 2018-06-26 ENCOUNTER — Ambulatory Visit: Payer: Medicaid Other

## 2018-07-04 ENCOUNTER — Other Ambulatory Visit: Payer: Self-pay | Admitting: *Deleted

## 2018-07-04 DIAGNOSIS — Z8632 Personal history of gestational diabetes: Secondary | ICD-10-CM

## 2018-07-05 ENCOUNTER — Other Ambulatory Visit: Payer: Medicaid Other

## 2018-07-05 ENCOUNTER — Other Ambulatory Visit: Payer: Self-pay

## 2018-07-05 DIAGNOSIS — Z8632 Personal history of gestational diabetes: Secondary | ICD-10-CM

## 2018-07-08 LAB — GLUCOSE TOLERANCE, 2 HOURS
Glucose, 2 hour: 105 mg/dL (ref 65–139)
Glucose, GTT - Fasting: 87 mg/dL (ref 65–99)

## 2018-07-10 ENCOUNTER — Ambulatory Visit: Payer: Medicaid Other | Admitting: Medical

## 2018-07-10 ENCOUNTER — Other Ambulatory Visit: Payer: Medicaid Other

## 2018-07-16 ENCOUNTER — Ambulatory Visit (INDEPENDENT_AMBULATORY_CARE_PROVIDER_SITE_OTHER): Payer: Medicaid Other | Admitting: Nurse Practitioner

## 2018-07-16 ENCOUNTER — Other Ambulatory Visit: Payer: Self-pay

## 2018-07-16 DIAGNOSIS — E669 Obesity, unspecified: Secondary | ICD-10-CM | POA: Insufficient documentation

## 2018-07-16 DIAGNOSIS — Z8632 Personal history of gestational diabetes: Secondary | ICD-10-CM

## 2018-07-16 MED ORDER — FLUCONAZOLE 150 MG PO TABS: 150.0000 mg | ORAL_TABLET | Freq: Every day | ORAL | 0 refills | Status: DC

## 2018-07-16 NOTE — Progress Notes (Signed)
TELEHEALTH VIRTUAL POSTPARTUM VISIT ENCOUNTER NOTE  I connected with@ on 07/16/18 at  3:15 PM EDT by telephone at home and verified that I am speaking with the correct person using two identifiers.   I discussed the limitations, risks, security and privacy concerns of performing an evaluation and management service by telephone and the availability of in person appointments. I also discussed with the patient that there may be a patient responsible charge related to this service. The patient expressed understanding and agreed to proceed.  Appointment Date: 07/16/2018  OBGYN Clinic:  [facility] [place]  Chief Complaint:  Chief Complaint  Patient presents with  . Postpartum Care    History of Present Illness: Joy Wood is a 23 y.o. G1P1  Client was seen for a Webex visit (patient identifiers x 2 were done) for her postpartum visit.  Her past medical history is significant for gestational diabetes which she took metformin.     She is s/p normal spontaneous vaginal delivery on 05-29-18 at 39 weeks; she was discharged to home on 05-31-18. Pregnancy complicated by gestational diabetes treated with Metformin. Baby is doing well - is not breastfeeding - pediatrician thinks her baby has a milk allergy.  Complaints:  No problems  Vaginal bleeding or discharge: No  Mode of feeding infant: Bottle Intercourse: No  Contraception: condoms PP depression s/s: No .  Any bowel or bladder issues: No  Pap smear: no abnormalities Next due in 2022  Review of Systems:  Her 12 point review of systems is negative or as noted in the History of Present Illness.  Patient Active Problem List   Diagnosis Date Noted  . Gestational diabetes mellitus (GDM) in third trimester 03/26/2018  . Rh negative state in antepartum period 03/22/2018  . Elevated BP without diagnosis of hypertension 03/22/2018  . Supervision of high-risk pregnancy, third trimester 12/18/2017  . History of prediabetes 05/04/2016  . PCOS  (polycystic ovarian syndrome) 04/03/2016  . Hyperlipidemia 04/03/2016  . Anemia 04/04/2015    Medications [name] IMerian Capron. Grissinger had no medications administered during this visit. Current Outpatient Medications  Medication Sig Dispense Refill  . diphenhydrAMINE (BENADRYL) 25 MG tablet Take 25 mg by mouth at bedtime as needed.    . famotidine (PEPCID) 20 MG tablet Take 20 mg by mouth daily.    . fluconazole (DIFLUCAN) 150 MG tablet Take 1 tablet (150 mg total) by mouth daily. 1 tablet 0  . ibuprofen (ADVIL,MOTRIN) 800 MG tablet Take 1 tablet (800 mg total) by mouth 3 (three) times daily. 30 tablet 0  . Prenatal Vit-Fe Fumarate-FA (PRENATAL MULTIVITAMIN) TABS tablet Take 1 tablet by mouth daily at 12 noon.    . senna-docusate (SENOKOT-S) 8.6-50 MG tablet Take 2 tablets by mouth at bedtime as needed for mild constipation. 10 tablet 0   No current facility-administered medications for this visit.     Allergies Patient has no known allergies.  Physical Exam:  General:  Alert, oriented and cooperative.   Mental Status: Normal mood and affect perceived. Normal judgment and thought content.  Rest of physical exam deferred due to type of encounter  PP Depression Screening:   Edinburgh Postnatal Depression Scale - 07/16/18 1527      Edinburgh Postnatal Depression Scale:  In the Past 7 Days   I have been able to laugh and see the funny side of things.  0    I have looked forward with enjoyment to things.  0    I have blamed myself  unnecessarily when things went wrong.  0    I have been anxious or worried for no good reason.  0    I have felt scared or panicky for no good reason.  0    Things have been getting on top of me.  0    I have been so unhappy that I have had difficulty sleeping.  0    I have felt sad or miserable.  0    I have been so unhappy that I have been crying.  0    The thought of harming myself has occurred to me.  0    Edinburgh Postnatal Depression Scale Total  0        Assessment:Patient is a 23 y.o. G1P0 who is 7 weeks postpartum from a normal spontaneous vaginal delivery.  She is doing well.   Plan:  1. Encounter for postpartum visit Doing well and plans to use condoms.  Advised to call the office if she needs another form of contraception.  Advised to use condoms with all intercourse.  Advised no pregnancy for 18 months.  2. History of gestational diabetes Some vulvar itching - bleeding stopped 2 days ago.  Has been wearing pads.  Prescribed one dose of Diflucan for possible yeast infection. Had 2 hr glucola yesterday which was normal.  3.  Obesity Advised weight loss to BMI less than 25 which would be less than 148 pounds.  Client will continue healthy eating and agrees to begin more physical exercise.   RTC in 1 year or sooner if she has problems.  I discussed the assessment and treatment plan with the patient. The patient was provided an opportunity to ask questions and all were answered. The patient agreed with the plan and demonstrated an understanding of the instructions.   The patient was advised to call back or seek an in-person evaluation/go to the ED for any concerning postpartum symptoms.  I provided 8 minutes of non-face-to-face time during this encounter.   Currie Paris, NP [facility] for [employer], [facility]

## 2018-07-17 ENCOUNTER — Ambulatory Visit: Payer: Medicaid Other | Admitting: Obstetrics and Gynecology

## 2018-07-17 ENCOUNTER — Encounter: Payer: Self-pay | Admitting: Nurse Practitioner

## 2019-03-06 IMAGING — US US FETAL BPP W/ NON-STRESS
1 series · 13 of 15 positions shown · non-contrast
Comparison: none

[Series 1: us fetal bpp w/nonstress · 15 acquisitions, 13 frames shown]
[im 1/15]
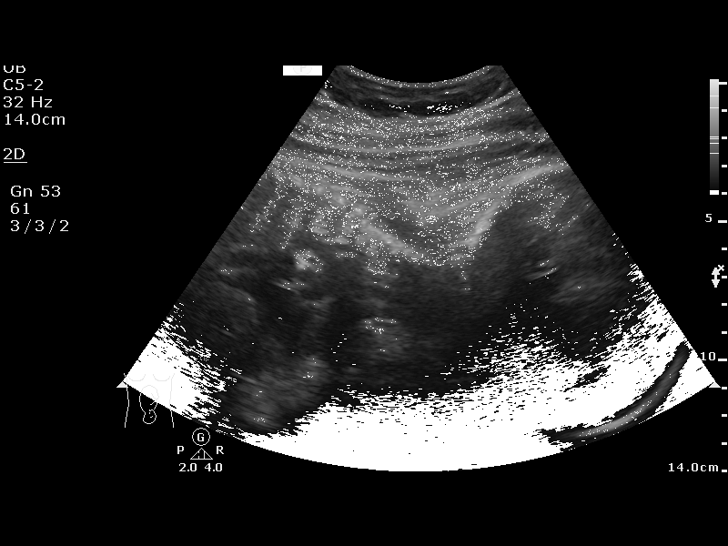
[im 2/15]
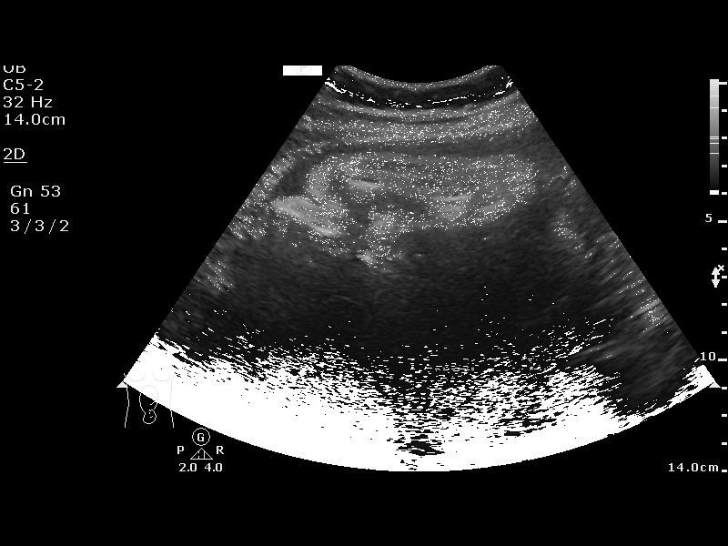
[im 3/15]
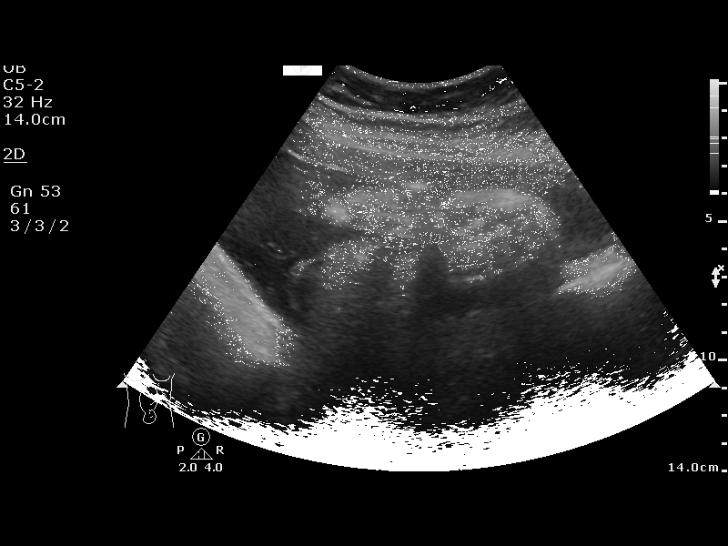
[im 5/15]
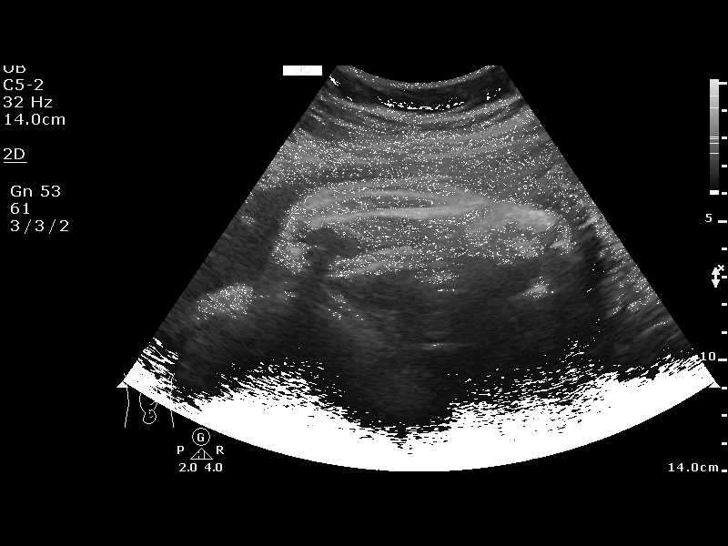
[im 6/15]
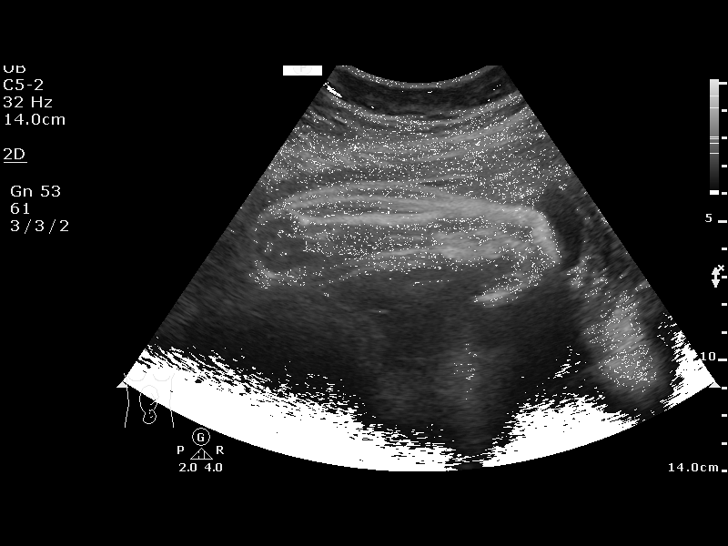
[im 7/15]
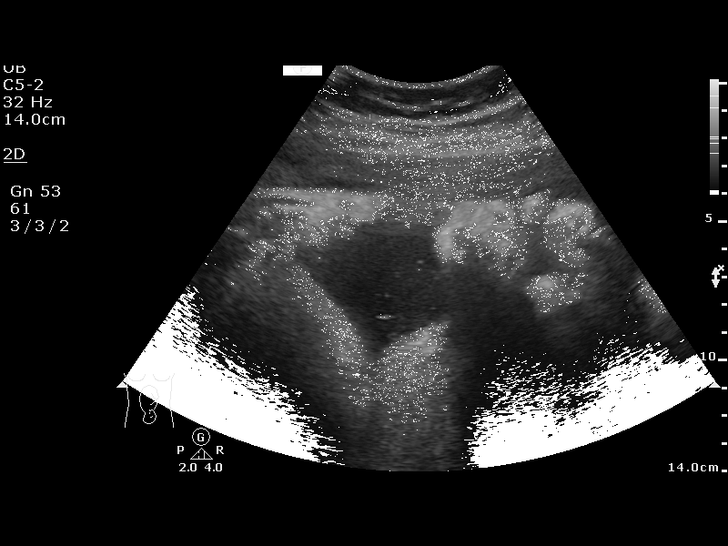
[im 8/15]
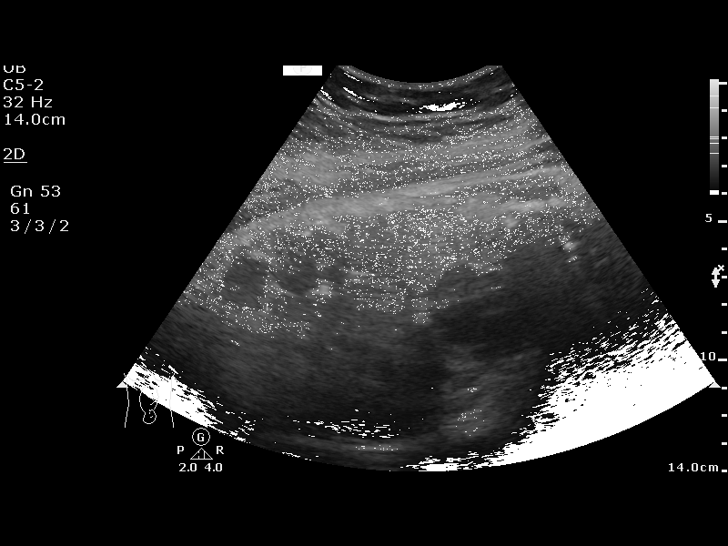
[im 9/15]
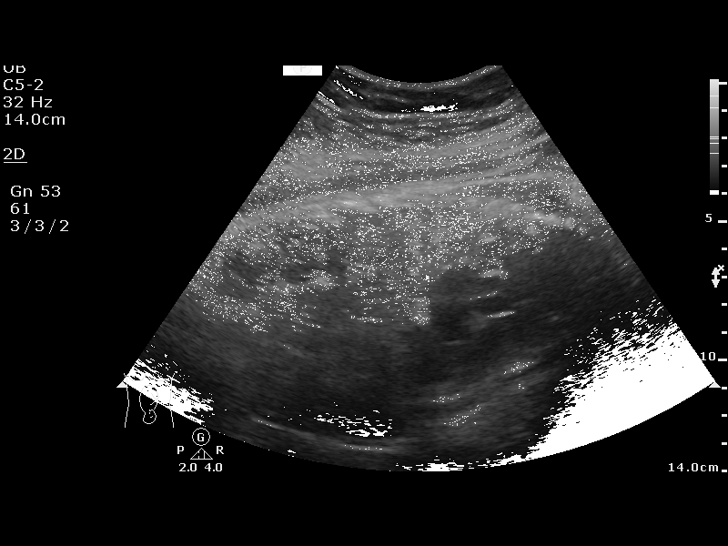
[im 10/15]
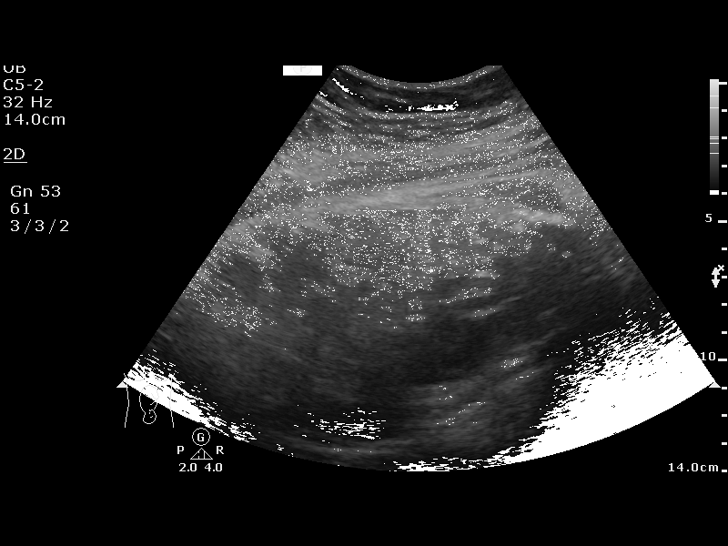
[im 11/15]
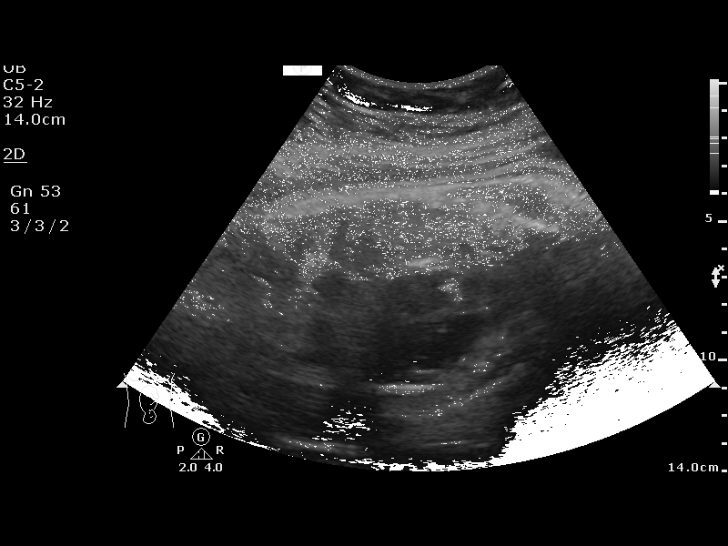
[im 13/15]
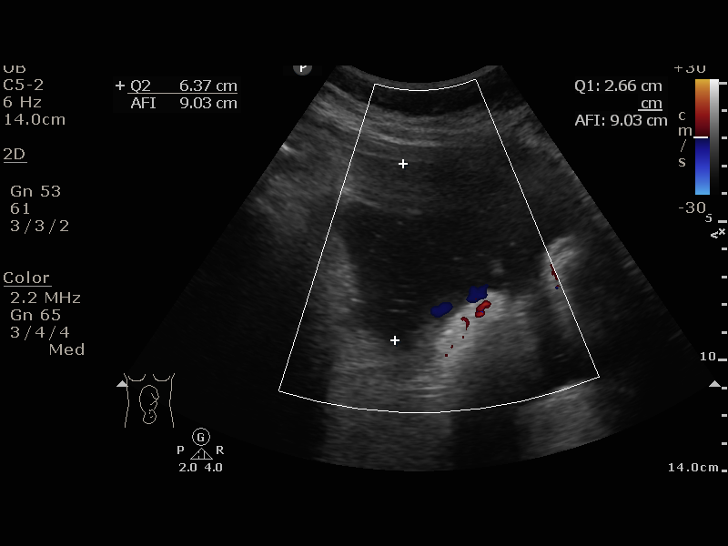
[im 14/15]
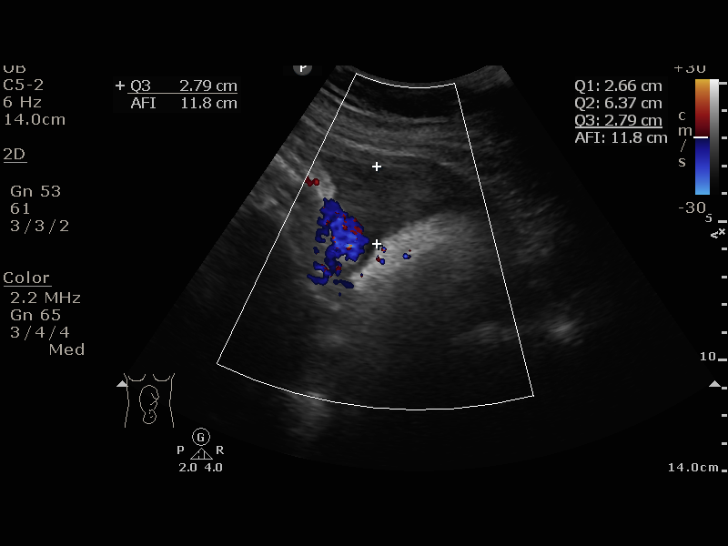
[im 15/15]
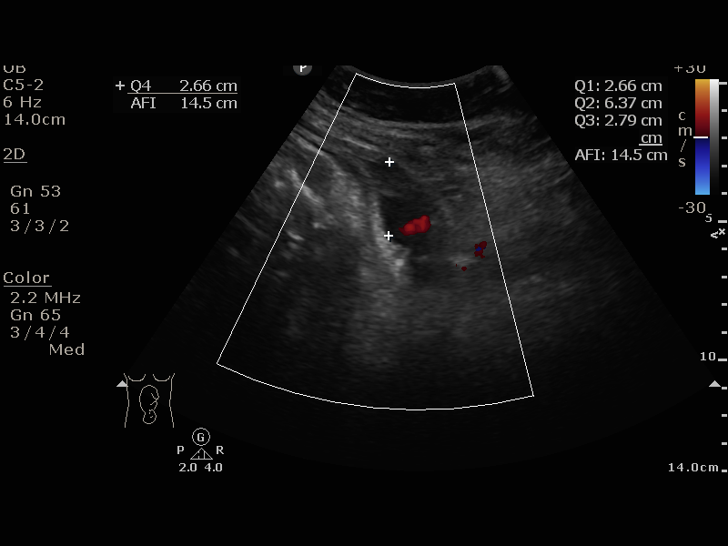

[13 of 15 positions shown; findings below may reference images not displayed]

OB/Gyn Clinic
                   [REDACTED]care
                   Healthcare                               [HOSPITAL]
                   OB/Gyn Clinic
                   [id number]

  1  US FETAL BPP W/NONSTRESS             76818.4      GEDITA GELAZIS
 ----------------------------------------------------------------------

 ----------------------------------------------------------------------
Service(s) Provided

 ----------------------------------------------------------------------
Indications

  36 weeks gestation of pregnancy
  Gestational diabetes in pregnancy,
  controlled by oral hypoglycemic drugs
 ----------------------------------------------------------------------
Fetal Evaluation

 Num Of Fetuses:         1
 Preg. Location:         Intrauterine
 Cardiac Activity:       Observed
 Presentation:           Cephalic

 Amniotic Fluid
 AFI FV:      Within normal limits

 AFI Sum(cm)     %Tile       Largest Pocket(cm)
 14.48           53
 RUQ(cm)       RLQ(cm)       LUQ(cm)        LLQ(cm)

Biophysical Evaluation

 Amniotic F.V:   Pocket => 2 cm two         F. Tone:        Observed
                 planes
 F. Movement:    Observed                   N.S.T:          Reactive
 F. Breathing:   Observed                   Score:          [DATE]
OB History

 Gravidity:    1         Term:   0        Prem:   0        SAB:   0
 TOP:          0       Ectopic:  0        Living: 0
Gestational Age

 LMP:           36w 1d        Date:  08/28/17                 EDD:   06/04/18
 Best:          36w 1d     Det. By:  LMP  (08/28/17)          EDD:   06/04/18
Impression

 Viable fetus in cephalic presentation
Recommendations

 Continue weekly antenatal testing till delivery.
 Follow up growth ultrasound next week
 Induction of labor at 39 weeks
               [place], [place]

## 2019-03-13 IMAGING — US US MFM OB FOLLOW-UP
1 series · 14 of 28 positions shown · non-contrast
Comparison: none

[Series 1: us mfm ob follow-up · 32 acquisitions, 14 frames shown]
[im 2/32]
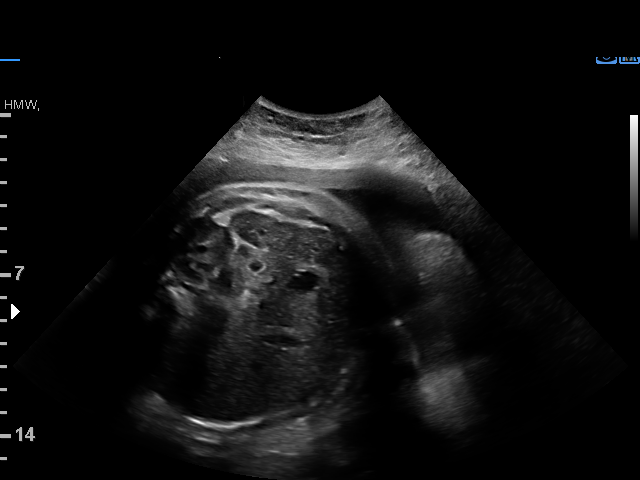
[im 4/32]
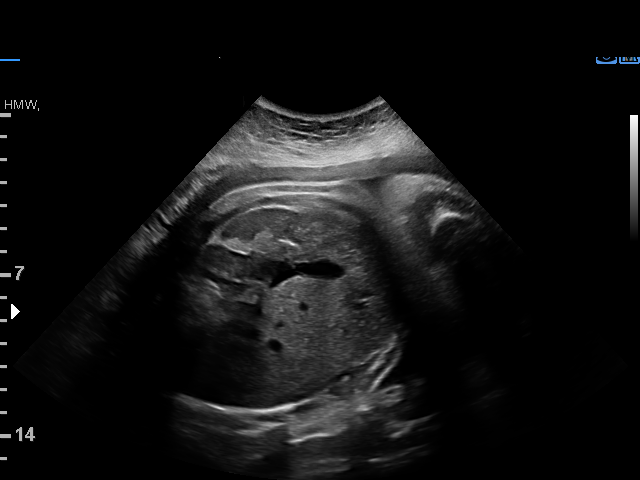
[im 6/32]
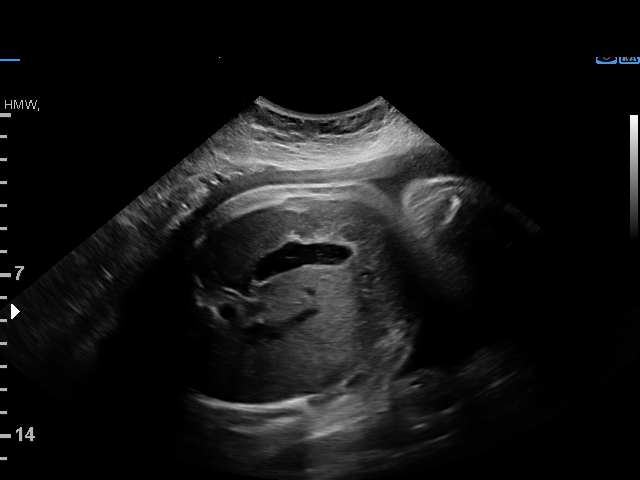
[im 9/32]
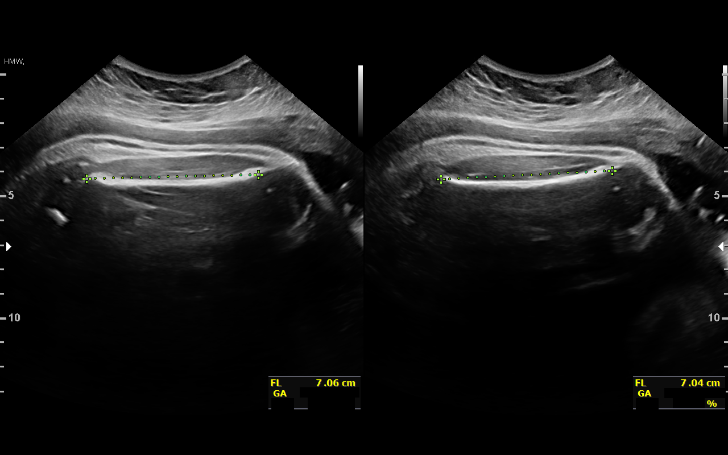
[im 11/32]
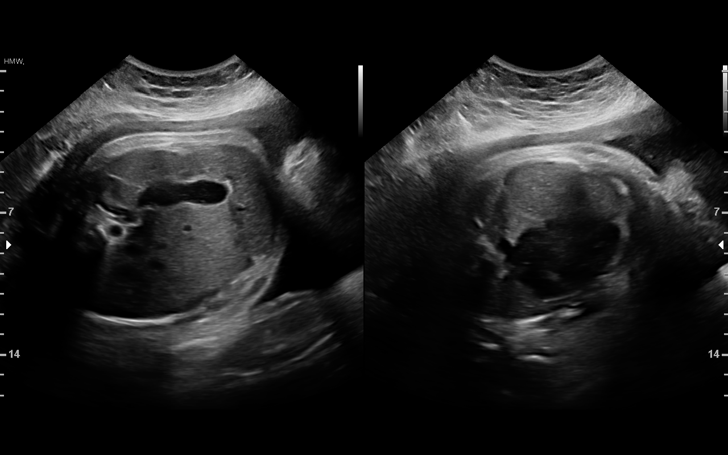
[im 13/32]
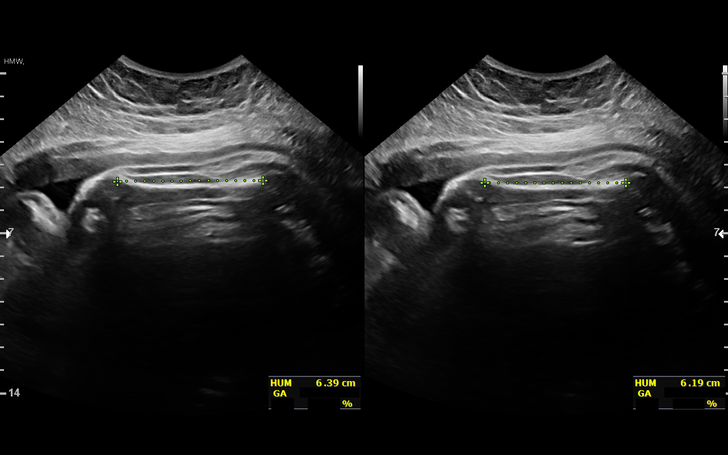
[im 15/32]
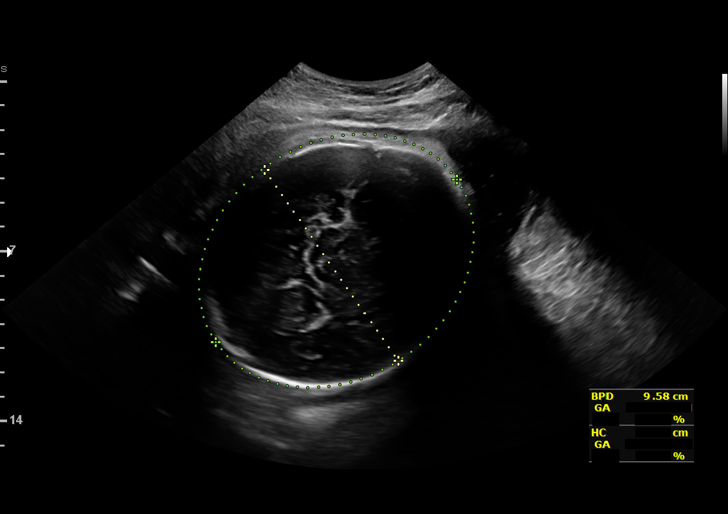
[im 18/32]
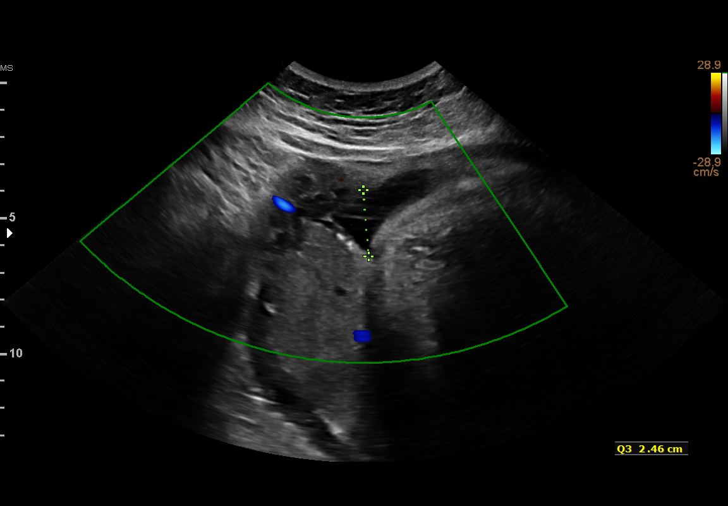
[im 20/32]
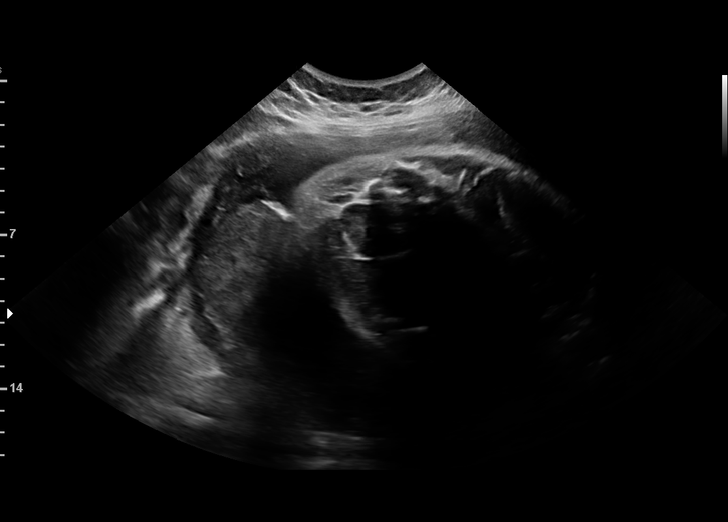
[im 22/32]
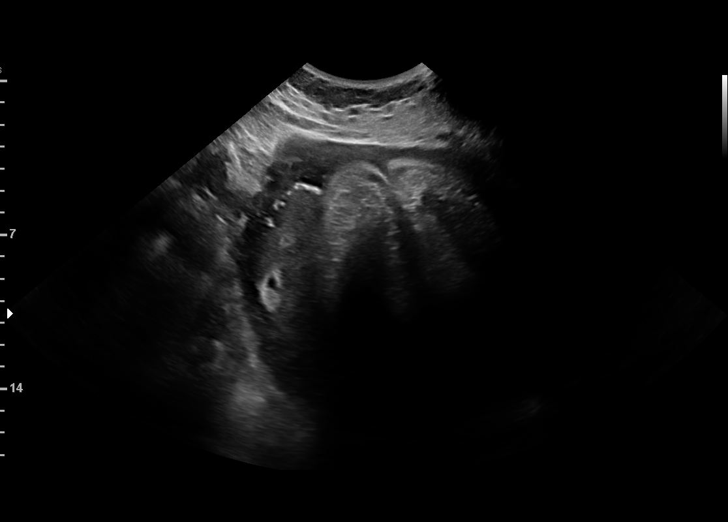
[im 25/32]
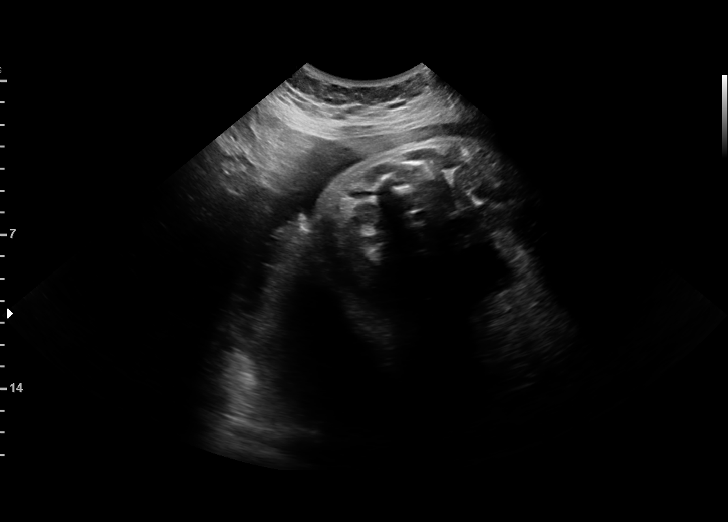
[im 27/32]
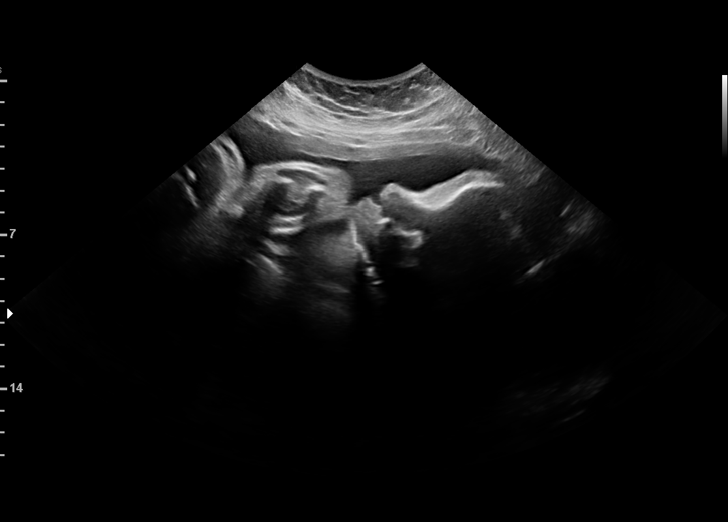
[im 29/32]
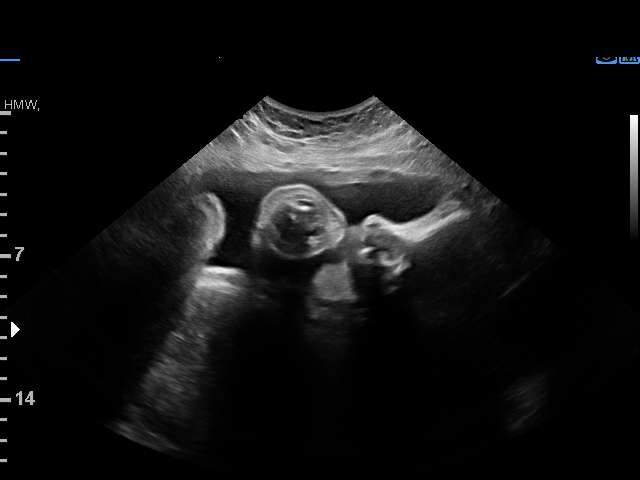
[im 32/32]
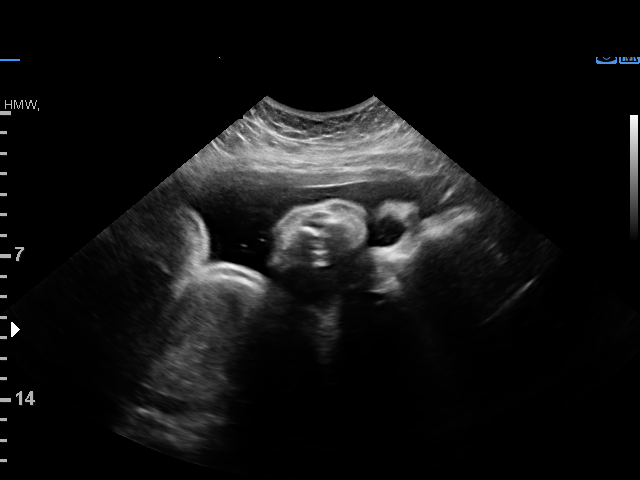

[14 of 28 positions shown; findings below may reference images not displayed]

[REDACTED]care
                    [facility]                                        OB/Gyn Clinic
                    OB/Gyn Clinic
                    [id number]

 ----------------------------------------------------------------------

 ----------------------------------------------------------------------
Indications

  37 weeks gestation of pregnancy
  Gestational diabetes in pregnancy, controlled
  by oral hypoglycemic drugs
  Rh negative state in antepartum
 ----------------------------------------------------------------------
Fetal Evaluation

 Num Of Fetuses:          1
 Fetal Heart Rate(bpm):   146
 Cardiac Activity:        Observed
 Presentation:            Cephalic
 Placenta:                Posterior
 P. Cord Insertion:       Visualized, central
 Amniotic Fluid
 AFI FV:      Within normal limits

 AFI Sum(cm)     %Tile       Largest Pocket(cm)
 15.5            58

 RUQ(cm)       RLQ(cm)        LUQ(cm)        LLQ(cm)

Biophysical Evaluation

 Amniotic F.V:   Within normal limits        F. Tone:         Observed
 F. Movement:    Observed                    Score:           [DATE]
 F. Breathing:   Observed
Biometry

 BPD:      96.2   mm     G. Age:  39w 2d         98  %    CI:         78.51  %    70 - 86
                                                          FL/HC:       20.5  %    20.8 -
 HC:      343.4   mm     G. Age:  39w 4d         81  %    HC/AC:       0.99       0.92 -
 AC:      346.6   mm     G. Age:  38w 4d         92  %    FL/BPD:      73.3  %    71 - 87
 FL:       70.5   mm     G. Age:  36w 1d         25  %    FL/AC:       20.3  %    20 - 24
 HUM:      62.9   mm     G. Age:  36w 4d         59  %

 Est. FW:    6868   gm      7 lb 9 oz     87  %
OB History

 Gravidity:     1         Term:  0          Prem:  0        SAB:   0
 TOP:           0       Ectopic: 0         Living: 0
Gestational Age

 LMP:            37w 1d       Date:  08/28/17                   EDD:  06/04/18
 U/S Today:      38w 3d                                         EDD:  05/26/18
 Best:           37w 1d    Det. By:  LMP  (08/28/17)            EDD:  06/04/18
Anatomy



 Other:   Heels and Right 5th digit previously visualized.
Cervix Uterus Adnexa

 Cervix
 Not visualized (advanced GA >92wks)

 Uterus
 No abnormality visualized.

 Left Ovary
 No adnexal mass visualized.

 Right Ovary
 No adnexal mass visualized.

 Cul De Sac
 No free fluid seen.

 Adnexa
 No abnormality visualized.
Impression

 Normal interval growth.
 Biophysical profile [DATE]
Recommendations

 Follow up as clinically indicated, but continue in office weekly
 antenatal testing.

## 2019-03-14 ENCOUNTER — Other Ambulatory Visit: Payer: Self-pay

## 2019-03-14 DIAGNOSIS — Z20822 Contact with and (suspected) exposure to covid-19: Secondary | ICD-10-CM

## 2019-03-15 LAB — NOVEL CORONAVIRUS, NAA: SARS-CoV-2, NAA: NOT DETECTED

## 2019-12-01 LAB — OB RESULTS CONSOLE GC/CHLAMYDIA
Chlamydia: NEGATIVE
Gonorrhea: NEGATIVE

## 2019-12-01 LAB — OB RESULTS CONSOLE RUBELLA ANTIBODY, IGM: Rubella: IMMUNE

## 2019-12-01 LAB — OB RESULTS CONSOLE HIV ANTIBODY (ROUTINE TESTING): HIV: NONREACTIVE

## 2019-12-01 LAB — OB RESULTS CONSOLE HEPATITIS B SURFACE ANTIGEN: Hepatitis B Surface Ag: NEGATIVE

## 2020-01-10 ENCOUNTER — Encounter (HOSPITAL_BASED_OUTPATIENT_CLINIC_OR_DEPARTMENT_OTHER): Payer: Self-pay | Admitting: Emergency Medicine

## 2020-01-10 ENCOUNTER — Other Ambulatory Visit: Payer: Self-pay

## 2020-01-10 ENCOUNTER — Emergency Department (HOSPITAL_BASED_OUTPATIENT_CLINIC_OR_DEPARTMENT_OTHER)
Admission: EM | Admit: 2020-01-10 | Discharge: 2020-01-10 | Disposition: A | Discharge status: Home / Self Care | Payer: Medicaid Other | Attending: Emergency Medicine | Admitting: Emergency Medicine

## 2020-01-10 DIAGNOSIS — U071 COVID-19: Secondary | ICD-10-CM | POA: Diagnosis not present

## 2020-01-10 DIAGNOSIS — Z3A14 14 weeks gestation of pregnancy: Secondary | ICD-10-CM | POA: Diagnosis not present

## 2020-01-10 DIAGNOSIS — O98511 Other viral diseases complicating pregnancy, first trimester: Secondary | ICD-10-CM | POA: Diagnosis not present

## 2020-01-10 DIAGNOSIS — E119 Type 2 diabetes mellitus without complications: Secondary | ICD-10-CM | POA: Insufficient documentation

## 2020-01-10 DIAGNOSIS — Z87891 Personal history of nicotine dependence: Secondary | ICD-10-CM | POA: Diagnosis not present

## 2020-01-10 DIAGNOSIS — R0602 Shortness of breath: Secondary | ICD-10-CM | POA: Diagnosis present

## 2020-01-10 DIAGNOSIS — E876 Hypokalemia: Secondary | ICD-10-CM | POA: Diagnosis not present

## 2020-01-10 LAB — CBC WITH DIFFERENTIAL/PLATELET
Abs Immature Granulocytes: 0.01 10*3/uL (ref 0.00–0.07)
Basophils Absolute: 0 10*3/uL (ref 0.0–0.1)
Basophils Relative: 0 %
Eosinophils Absolute: 0 10*3/uL (ref 0.0–0.5)
Eosinophils Relative: 0 %
HCT: 37 % (ref 36.0–46.0)
Hemoglobin: 12.3 g/dL (ref 12.0–15.0)
Immature Granulocytes: 0 %
Lymphocytes Relative: 32 %
Lymphs Abs: 1.8 10*3/uL (ref 0.7–4.0)
MCH: 27.3 pg (ref 26.0–34.0)
MCHC: 33.2 g/dL (ref 30.0–36.0)
MCV: 82 fL (ref 80.0–100.0)
Monocytes Absolute: 0.3 10*3/uL (ref 0.1–1.0)
Monocytes Relative: 6 %
Neutro Abs: 3.4 10*3/uL (ref 1.7–7.7)
Neutrophils Relative %: 62 %
Platelets: 258 10*3/uL (ref 150–400)
RBC: 4.51 MIL/uL (ref 3.87–5.11)
RDW: 14.9 % (ref 11.5–15.5)
WBC: 5.6 10*3/uL (ref 4.0–10.5)
nRBC: 0 % (ref 0.0–0.2)

## 2020-01-10 LAB — BASIC METABOLIC PANEL
Anion gap: 9 (ref 5–15)
BUN: 8 mg/dL (ref 6–20)
CO2: 24 mmol/L (ref 22–32)
Calcium: 8.5 mg/dL — ABNORMAL LOW (ref 8.9–10.3)
Chloride: 102 mmol/L (ref 98–111)
Creatinine, Ser: 0.59 mg/dL (ref 0.44–1.00)
GFR, Estimated: >60 mL/min (ref >60)
Glucose, Bld: 109 mg/dL — ABNORMAL HIGH (ref 70–99)
Potassium: 3.4 mmol/L — ABNORMAL LOW (ref 3.5–5.1)
Sodium: 135 mmol/L (ref 135–145)

## 2020-01-10 NOTE — ED Provider Notes (Signed)
[facility] EMERGENCY DEPARTMENT Provider Note   CSN: [id number] Arrival date & time: 01/10/20  2030     History Chief Complaint  Patient presents with  . Shortness of Breath    covid+ 10/5    Joy Wood is a 24 y.o. female.  HPI      Joy Wood is a 24 y.o. female, with a history of anemia, gestational diabetes, hyperlipidemia, presenting to the ED with shortness of breath beginning last night. Began to have symptoms of cough, loss in taste, nausea beginning October 3.  No vomiting October 4, but none since.  Diagnosed with Covid October 4.  Not vaccinated against Covid. Patient also states she is about [redacted] weeks pregnant, followed by [facility].  Estimated due date July 08, 2020.  Denies fever, diarrhea, chest pain, abdominal pain, vaginal bleeding, syncope, or any other complaints.   Past Medical History:  Diagnosis Date  . Anemia 2017   iron deficiency  . Anxiety   . Diabetes mellitus without complication (HCC)    gestational; take metformin  . Hyperlipidemia 2018  . Menorrhagia with irregular cycle   . Onychomycosis of toenail 04/30/2017  . PCOS (polycystic ovarian syndrome) 2018   Diagnosed in [place].  History of ovarian cysts there with weight issues and insulin resistance.  . Weight gain     Patient Active Problem List   Diagnosis Date Noted  . Obesity (BMI 30.0-34.9) 07/16/2018  . History of gestational diabetes 03/26/2018  . History of prediabetes 05/04/2016  . PCOS (polycystic ovarian syndrome) 04/03/2016  . Hyperlipidemia 04/03/2016  . Anemia 04/04/2015    Past Surgical History:  Procedure Laterality Date  . WISDOM TOOTH EXTRACTION       OB History    Gravida  2   Para      Term      Preterm      AB      Living        SAB      TAB      Ectopic      Multiple      Live Births              Family History  Problem Relation Age of Onset  . Anemia Mother   . Diabetes Father   . Hyperlipidemia  Father   . Hypertension Father   . Cancer Father     Social History   Tobacco Use  . Smoking status: Former Games developer  . Smokeless tobacco: Never Used  . Tobacco comment: Hookah--twice weekly  Vaping Use  . Vaping Use: Never used  Substance Use Topics  . Alcohol use: No  . Drug use: No    Home Medications Prior to Admission medications   Medication Sig Start Date End Date Taking? Authorizing Provider  fluconazole (DIFLUCAN) 150 MG tablet Take 1 tablet (150 mg total) by mouth daily. 07/16/18   Burleson, Brand Males, NP  ibuprofen (ADVIL,MOTRIN) 800 MG tablet Take 1 tablet (800 mg total) by mouth 3 (three) times daily. 05/31/18   Tamera Stands, DO    Allergies    Patient has no known allergies.  Review of Systems   Review of Systems  Constitutional: Negative for chills and fever.  Respiratory: Positive for cough and shortness of breath.   Gastrointestinal: Positive for nausea. Negative for abdominal pain, diarrhea and vomiting.  Genitourinary: Negative for vaginal bleeding.  Neurological: Negative for dizziness and syncope.  All other systems reviewed and are  negative.   Physical Exam Updated Vital Signs BP 112/64 (BP Location: Left Arm)   Pulse 91   Temp 98.1 F (36.7 C)   Resp 18   Ht 5\' 4"  (1.626 m)   Wt 90.4 kg   LMP 10/04/2019 (Approximate)   SpO2 100%   BMI 34.21 kg/m   Physical Exam Vitals and nursing note reviewed.  Constitutional:      General: She is not in acute distress.    Appearance: She is well-developed. She is not diaphoretic.  HENT:     Head: Normocephalic and atraumatic.     Mouth/Throat:     Mouth: Mucous membranes are moist.     Pharynx: Oropharynx is clear.  Eyes:     Conjunctiva/sclera: Conjunctivae normal.  Cardiovascular:     Rate and Rhythm: Normal rate and regular rhythm.     Pulses: Normal pulses.          Radial pulses are 2+ on the right side and 2+ on the left side.       Posterior tibial pulses are 2+ on the right side and  2+ on the left side.     Heart sounds: Normal heart sounds.     Comments: Tactile temperature in the extremities appropriate and equal bilaterally. Pulmonary:     Effort: Pulmonary effort is normal. No respiratory distress.     Breath sounds: Normal breath sounds.     Comments: No increased work of breathing.  Speaks in full sentences without difficulty. Abdominal:     Palpations: Abdomen is soft.     Tenderness: There is no abdominal tenderness. There is no guarding.  Musculoskeletal:     Cervical back: Neck supple.     Right lower leg: No edema.     Left lower leg: No edema.  Lymphadenopathy:     Cervical: No cervical adenopathy.  Skin:    General: Skin is warm and dry.  Neurological:     Mental Status: She is alert.  Psychiatric:        Mood and Affect: Mood and affect normal.        Speech: Speech normal.        Behavior: Behavior normal.     ED Results / Procedures / Treatments   Labs (all labs ordered are listed, but only abnormal results are displayed) Labs Reviewed  BASIC METABOLIC PANEL - Abnormal; Notable for the following components:      Result Value   Potassium 3.4 (*)    Glucose, Bld 109 (*)    Calcium 8.5 (*)    All other components within normal limits  CBC WITH DIFFERENTIAL/PLATELET    EKG None  Radiology No results found.  Procedures Procedures (including critical care time)  Medications Ordered in ED Medications - No data to display  ED Course  I have reviewed the triage vital signs and the nursing notes.  Pertinent labs & imaging results that were available during my care of the patient were reviewed by me and considered in my medical decision making (see chart for details).    MDM Rules/Calculators/A&P                          Patient presents with complaint of shortness of breath in the setting of COVID-19 infection. Patient is nontoxic appearing, afebrile, not tachycardic upon my assessment, not tachypneic, not hypotensive,  maintains excellent SPO2 on room air, and is in no apparent distress.   I have  reviewed the patient's chart to obtain more information.   I reviewed and interpreted the patient's labs.   Very mild hypokalemia noted and addressed in discharge instructions. Based on overall assessment, lung sounds, vital signs, and with consideration of the patient's pregnancy, I do not think the benefit of obtaining chest x-ray outweighs the risks at this time. Message sent to [facility] infusion team.  The patient was given instructions for home care as well as return precautions. Patient voices understanding of these instructions, accepts the plan, and is comfortable with discharge.   Findings and plan of care discussed with Marianna Fuss, MD.   Vitals:   01/10/20 2036 01/10/20 2044 01/10/20 2230 01/10/20 2242  BP: 124/74 112/64 115/72 115/72  Pulse: (!) 107 91 85 80  Resp: 20 18  18   Temp: 98.7 F (37.1 C) 98.1 F (36.7 C)    TempSrc: Oral     SpO2: 100% 100% 99% 100%  Weight: 90.4 kg     Height: 5\' 4"  (1.626 m)        Final Clinical Impression(s) / ED Diagnoses Final diagnoses:  COVID-19  Hypokalemia    Rx / DC Orders ED Discharge Orders    None       01/10/20 2255    Concepcion Living, MD 01/12/20 1235

## 2020-01-10 NOTE — ED Triage Notes (Signed)
Pt reports shortness of breath since last night. Speaks in full sentences. Covid+ on Tuesday 10/5, symptoms began on monday

## 2020-01-10 NOTE — Discharge Instructions (Addendum)
Your labs were overall reassuring. Your potassium was a little low.  This may be addressed through dietary changes.  This should be rechecked through your primary care provider or OB/GYN.  COVID-19 Home Management:  You have had a positive test for COVID-19. COVID-19 is caused by a virus. Viruses do not require or respond to antibiotics. Treatment is symptomatic care and it is important to note that these symptoms may last for 7-14 days.   Hand washing: Wash your hands throughout the day, but especially before and after touching the face, using the restroom, sneezing, coughing, or touching surfaces that have been coughed or sneezed upon. Hydration: Symptoms of most illnesses will be intensified and complicated by dehydration. Dehydration can also extend the duration of symptoms. Drink plenty of fluids and get plenty of rest. You should be drinking at least half a liter of water an hour to stay hydrated. Electrolyte drinks (ex. Gatorade, Powerade, Pedialyte) are also encouraged. You should be drinking enough fluids to make your urine light yellow, almost clear. If this is not the case, you are not drinking enough water. Please note that some of the treatments indicated below will not be effective if you are not adequately hydrated. Diet: Please concentrate on hydration, however, you may introduce food slowly.  Start with a clear liquid diet, progressed to a full liquid diet, and then bland solids as you are able. Pain or fever: Tylenol Acetaminophen: May take acetaminophen (generic for Tylenol), as needed, for pain. Your daily total maximum amount of acetaminophen from all sources should be limited to 4000mg /day for persons without liver problems, or 2000mg /day for those with liver problems. Cough: Teas, warm liquids, broths, and honey can help with cough. Zyrtec or Claritin: May add these medication daily to control underlying symptoms of congestion, sneezing, and other signs of allergies.  These  medications are available over-the-counter. Generics: Cetirizine (generic for Zyrtec) and loratadine (generic for Claritin). Fluticasone: Use fluticasone (generic for Flonase), as directed, for nasal and sinus congestion.  This medication is available over-the-counter. Congestion: Saline sinus rinses and saline nasal sprays may help relieve congestion.  Sore throat: Warm liquids or Chloraseptic spray may help soothe a sore throat. Gargle twice a day with a salt water solution made from a half teaspoon of salt in a cup of warm water.  Follow up: Follow up with a primary care provider within the next two weeks should symptoms fail to resolve. Return: Return to the ED for significantly worsening symptoms, shortness of breath, persistent/worsening chest pain, persistent vomiting, large amounts of blood in stool, worsening/localized abdominal pain, or any other major concerns.  For prescription assistance, may try using prescription discount sites or apps, such as goodrx.com  Your symptoms are likely consistent with a viral illness. Viruses do not require or respond to antibiotics. Treatment is symptomatic care and it is important to note that these symptoms may last for 7-14 days.   Hand washing: Wash your hands throughout the day, but especially before and after touching the face, using the restroom, sneezing, coughing, or touching surfaces that have been coughed or sneezed upon. Hydration: Symptoms of most illnesses will be intensified and complicated by dehydration. Dehydration can also extend the duration of symptoms. Drink plenty of fluids and get plenty of rest. You should be drinking at least half a liter of water an hour to stay hydrated. Electrolyte drinks (ex. Gatorade, Powerade, Pedialyte) are also encouraged. You should be drinking enough fluids to make your urine light yellow, almost clear.  If this is not the case, you are not drinking enough water. Please note that some of the treatments  indicated below will not be effective if you are not adequately hydrated. Diet: Please concentrate on hydration, however, you may introduce food slowly.  Start with a clear liquid diet, progressed to a full liquid diet, and then bland solids as you are able. Pain or fever: Tylenol Acetaminophen: May take acetaminophen (generic for Tylenol), as needed, for pain. Your daily total maximum amount of acetaminophen from all sources should be limited to 4000mg /day for persons without liver problems, or 2000mg /day for those with liver problems. Zyrtec or Claritin: May add these medication daily to control underlying symptoms of congestion, sneezing, and other signs of allergies.  These medications are available over-the-counter. Generics: Cetirizine (generic for Zyrtec) and loratadine (generic for Claritin). Fluticasone: Use fluticasone (generic for Flonase), as directed, for nasal and sinus congestion.  This medication is available over-the-counter. Congestion: Plain guaifenesin (generic for plain Mucinex) may help relieve congestion. Saline sinus rinses and saline nasal sprays may also help relieve congestion.  Sore throat: Warm liquids or Chloraseptic spray may help soothe a sore throat. Gargle twice a day with a salt water solution made from a half teaspoon of salt in a cup of warm water.  Follow up: Follow up with a primary care provider within the next two weeks should symptoms fail to resolve. Return: Return to the ED for significantly worsening symptoms, shortness of breath, persistent vomiting, large amounts of blood in stool, or any other major concerns.  For prescription assistance, may try using prescription discount sites or apps, such as goodrx.com  COVID-19 isolation recommendations  Patients who have symptoms consistent with COVID-19 should self isolate until: At least 3 days (72 hours) have passed since recovery, defined as resolution of fever without the use of fever reducing medications  and improvement in respiratory symptoms (e.g., cough, shortness of breath), and At least 7 days have passed since symptoms first appeared. Retesting is not required and not recommended as patients can continue to test positive for several weeks despite lack of symptoms.

## 2020-01-11 ENCOUNTER — Telehealth: Payer: Self-pay | Admitting: Physician Assistant

## 2020-01-11 NOTE — Telephone Encounter (Signed)
Called to discuss with patient about Covid symptoms and the use of casirivimab/imdevimab, a monoclonal antibody infusion for those with mild to moderate Covid symptoms and at a high risk of hospitalization.  Pt is qualified for this infusion at the [facility] center due to; Specific high risk criteria : Pregnancy.    Message left to call back our hotline [phone] and sent mychart message.  Cline Crock PA-C  [facility]

## 2020-07-05 ENCOUNTER — Encounter (HOSPITAL_COMMUNITY): Payer: Self-pay | Admitting: Obstetrics and Gynecology

## 2020-07-05 ENCOUNTER — Inpatient Hospital Stay (HOSPITAL_COMMUNITY): Payer: Medicaid Other | Admitting: Anesthesiology

## 2020-07-05 ENCOUNTER — Inpatient Hospital Stay (HOSPITAL_COMMUNITY)
Admission: AD | Admit: 2020-07-05 | Discharge: 2020-07-07 | DRG: 807 | Disposition: A | Discharge status: Home / Self Care | Payer: Medicaid Other | Attending: Obstetrics and Gynecology | Admitting: Obstetrics and Gynecology

## 2020-07-05 ENCOUNTER — Other Ambulatory Visit: Payer: Self-pay

## 2020-07-05 DIAGNOSIS — O99214 Obesity complicating childbirth: Secondary | ICD-10-CM | POA: Diagnosis present

## 2020-07-05 DIAGNOSIS — Z20822 Contact with and (suspected) exposure to covid-19: Secondary | ICD-10-CM | POA: Diagnosis present

## 2020-07-05 DIAGNOSIS — Z87891 Personal history of nicotine dependence: Secondary | ICD-10-CM

## 2020-07-05 DIAGNOSIS — Z3A39 39 weeks gestation of pregnancy: Secondary | ICD-10-CM | POA: Diagnosis not present

## 2020-07-05 DIAGNOSIS — O99824 Streptococcus B carrier state complicating childbirth: Secondary | ICD-10-CM | POA: Diagnosis present

## 2020-07-05 DIAGNOSIS — O26893 Other specified pregnancy related conditions, third trimester: Secondary | ICD-10-CM | POA: Diagnosis present

## 2020-07-05 LAB — RESP PANEL BY RT-PCR (FLU A&B, COVID) ARPGX2
Influenza A by PCR: NEGATIVE
Influenza B by PCR: NEGATIVE
SARS Coronavirus 2 by RT PCR: NEGATIVE

## 2020-07-05 LAB — TYPE AND SCREEN
ABO/RH(D): A NEG
Antibody Screen: NEGATIVE

## 2020-07-05 LAB — CBC
HCT: 32.4 % — ABNORMAL LOW (ref 36.0–46.0)
Hemoglobin: 10.3 g/dL — ABNORMAL LOW (ref 12.0–15.0)
MCH: 25.3 pg — ABNORMAL LOW (ref 26.0–34.0)
MCHC: 31.8 g/dL (ref 30.0–36.0)
MCV: 79.6 fL — ABNORMAL LOW (ref 80.0–100.0)
Platelets: 268 10*3/uL (ref 150–400)
RBC: 4.07 MIL/uL (ref 3.87–5.11)
RDW: 17.1 % — ABNORMAL HIGH (ref 11.5–15.5)
WBC: 12 10*3/uL — ABNORMAL HIGH (ref 4.0–10.5)
nRBC: 0 % (ref 0.0–0.2)

## 2020-07-05 LAB — POCT FERN TEST: POCT Fern Test: POSITIVE

## 2020-07-05 LAB — RPR: RPR Ser Ql: NONREACTIVE

## 2020-07-05 MED ORDER — FENTANYL-BUPIVACAINE-NACL 0.5-0.125-0.9 MG/250ML-% EP SOLN
12.0000 mL/h | EPIDURAL | Status: DC | PRN
  Administered 2020-07-05: 14 mL/h via EPIDURAL

## 2020-07-05 MED ORDER — IBUPROFEN 600 MG PO TABS
600.0000 mg | ORAL_TABLET | Freq: Four times a day (QID) | ORAL | Status: DC
  Administered 2020-07-05 – 2020-07-07 (×7): 600 mg via ORAL
  Filled 2020-07-05 (×7): qty 1

## 2020-07-05 MED ORDER — SIMETHICONE 80 MG PO CHEW: 80.0000 mg | CHEWABLE_TABLET | ORAL | Status: DC | PRN

## 2020-07-05 MED ORDER — OXYCODONE-ACETAMINOPHEN 5-325 MG PO TABS
2.0000 | ORAL_TABLET | ORAL | Status: DC | PRN
Start: 2020-07-05 — End: 2020-07-05

## 2020-07-05 MED ORDER — PHENYLEPHRINE 40 MCG/ML (10ML) SYRINGE FOR IV PUSH (FOR BLOOD PRESSURE SUPPORT)
80.0000 ug | PREFILLED_SYRINGE | INTRAVENOUS | Status: DC | PRN
  Administered 2020-07-05: 80 ug via INTRAVENOUS
  Filled 2020-07-05: qty 10

## 2020-07-05 MED ORDER — TETANUS-DIPHTH-ACELL PERTUSSIS 5-2.5-18.5 LF-MCG/0.5 IM SUSY: 0.5000 mL | PREFILLED_SYRINGE | Freq: Once | INTRAMUSCULAR | Status: DC

## 2020-07-05 MED ORDER — OXYTOCIN BOLUS FROM INFUSION
333.0000 mL | Freq: Once | INTRAVENOUS | Status: AC
  Administered 2020-07-05: 333 mL via INTRAVENOUS

## 2020-07-05 MED ORDER — WITCH HAZEL-GLYCERIN EX PADS: 1.0000 "application " | MEDICATED_PAD | CUTANEOUS | Status: DC | PRN

## 2020-07-05 MED ORDER — LIDOCAINE HCL (PF) 1 % IJ SOLN
INTRAMUSCULAR | Status: DC | PRN
  Administered 2020-07-05: 10 mL via EPIDURAL

## 2020-07-05 MED ORDER — COCONUT OIL OIL: 1.0000 "application " | TOPICAL_OIL | Status: DC | PRN

## 2020-07-05 MED ORDER — ZOLPIDEM TARTRATE 5 MG PO TABS: 5.0000 mg | ORAL_TABLET | Freq: Every evening | ORAL | Status: DC | PRN

## 2020-07-05 MED ORDER — ONDANSETRON HCL 4 MG/2ML IJ SOLN: 4.0000 mg | INTRAMUSCULAR | Status: DC | PRN

## 2020-07-05 MED ORDER — LACTATED RINGERS IV SOLN: 500.0000 mL | Freq: Once | INTRAVENOUS | Status: DC

## 2020-07-05 MED ORDER — LACTATED RINGERS IV SOLN
500.0000 mL | INTRAVENOUS | Status: DC | PRN
Start: 2020-07-05 — End: 2020-07-05
  Administered 2020-07-05: 500 mL via INTRAVENOUS

## 2020-07-05 MED ORDER — LIDOCAINE HCL (PF) 1 % IJ SOLN: 30.0000 mL | INTRAMUSCULAR | Status: DC | PRN

## 2020-07-05 MED ORDER — FENTANYL-BUPIVACAINE-NACL 0.5-0.125-0.9 MG/250ML-% EP SOLN
EPIDURAL | Status: AC
  Filled 2020-07-05: qty 250

## 2020-07-05 MED ORDER — BUPIVACAINE HCL (PF) 0.25 % IJ SOLN
INTRAMUSCULAR | Status: DC | PRN
  Administered 2020-07-05: 10 mL via EPIDURAL
  Administered 2020-07-05: 5 mL via EPIDURAL

## 2020-07-05 MED ORDER — PENICILLIN G POT IN DEXTROSE 60000 UNIT/ML IV SOLN
3.0000 10*6.[IU] | INTRAVENOUS | Status: DC
  Administered 2020-07-05: 3 10*6.[IU] via INTRAVENOUS
  Filled 2020-07-05: qty 50

## 2020-07-05 MED ORDER — EPHEDRINE 5 MG/ML INJ: 10.0000 mg | INTRAVENOUS | Status: DC | PRN

## 2020-07-05 MED ORDER — ACETAMINOPHEN 325 MG PO TABS: 650.0000 mg | ORAL_TABLET | ORAL | Status: DC | PRN

## 2020-07-05 MED ORDER — OXYCODONE-ACETAMINOPHEN 5-325 MG PO TABS
1.0000 | ORAL_TABLET | ORAL | Status: DC | PRN
Start: 2020-07-05 — End: 2020-07-05

## 2020-07-05 MED ORDER — ONDANSETRON HCL 4 MG PO TABS: 4.0000 mg | ORAL_TABLET | ORAL | Status: DC | PRN

## 2020-07-05 MED ORDER — ACETAMINOPHEN 325 MG PO TABS
650.0000 mg | ORAL_TABLET | ORAL | Status: DC | PRN
  Administered 2020-07-06: 650 mg via ORAL
  Filled 2020-07-05: qty 2

## 2020-07-05 MED ORDER — OXYCODONE HCL 5 MG PO TABS
5.0000 mg | ORAL_TABLET | ORAL | Status: DC | PRN
  Administered 2020-07-05: 5 mg via ORAL
  Filled 2020-07-05: qty 1

## 2020-07-05 MED ORDER — OXYTOCIN-SODIUM CHLORIDE 30-0.9 UT/500ML-% IV SOLN
2.5000 [IU]/h | INTRAVENOUS | Status: DC
  Filled 2020-07-05: qty 500

## 2020-07-05 MED ORDER — PRENATAL MULTIVITAMIN CH
1.0000 | ORAL_TABLET | Freq: Every day | ORAL | Status: DC
  Administered 2020-07-06: 1 via ORAL
  Filled 2020-07-05: qty 1

## 2020-07-05 MED ORDER — LACTATED RINGERS IV SOLN: INTRAVENOUS | Status: DC

## 2020-07-05 MED ORDER — FLEET ENEMA 7-19 GM/118ML RE ENEM: 1.0000 | ENEMA | RECTAL | Status: DC | PRN

## 2020-07-05 MED ORDER — ONDANSETRON HCL 4 MG/2ML IJ SOLN: 4.0000 mg | Freq: Four times a day (QID) | INTRAMUSCULAR | Status: DC | PRN

## 2020-07-05 MED ORDER — PHENYLEPHRINE 40 MCG/ML (10ML) SYRINGE FOR IV PUSH (FOR BLOOD PRESSURE SUPPORT): 80.0000 ug | PREFILLED_SYRINGE | INTRAVENOUS | Status: DC | PRN

## 2020-07-05 MED ORDER — SODIUM CHLORIDE 0.9 % IV SOLN
5.0000 10*6.[IU] | Freq: Once | INTRAVENOUS | Status: AC
  Administered 2020-07-05: 5 10*6.[IU] via INTRAVENOUS
  Filled 2020-07-05: qty 5

## 2020-07-05 MED ORDER — DIPHENHYDRAMINE HCL 50 MG/ML IJ SOLN: 12.5000 mg | INTRAMUSCULAR | Status: DC | PRN

## 2020-07-05 MED ORDER — SOD CITRATE-CITRIC ACID 500-334 MG/5ML PO SOLN: 30.0000 mL | ORAL | Status: DC | PRN

## 2020-07-05 MED ORDER — SENNOSIDES-DOCUSATE SODIUM 8.6-50 MG PO TABS
2.0000 | ORAL_TABLET | Freq: Every day | ORAL | Status: DC
  Administered 2020-07-06: 2 via ORAL
  Filled 2020-07-05: qty 2

## 2020-07-05 MED ORDER — FENTANYL CITRATE (PF) 100 MCG/2ML IJ SOLN
50.0000 ug | INTRAMUSCULAR | Status: DC | PRN
  Administered 2020-07-05: 100 ug via INTRAVENOUS
  Filled 2020-07-05: qty 2

## 2020-07-05 MED ORDER — DIBUCAINE (PERIANAL) 1 % EX OINT: 1.0000 "application " | TOPICAL_OINTMENT | CUTANEOUS | Status: DC | PRN

## 2020-07-05 MED ORDER — OXYCODONE HCL 5 MG PO TABS: 10.0000 mg | ORAL_TABLET | ORAL | Status: DC | PRN

## 2020-07-05 MED ORDER — DIPHENHYDRAMINE HCL 25 MG PO CAPS
25.0000 mg | ORAL_CAPSULE | Freq: Four times a day (QID) | ORAL | Status: DC | PRN
Start: 2020-07-05 — End: 2020-07-07

## 2020-07-05 MED ORDER — BENZOCAINE-MENTHOL 20-0.5 % EX AERO
1.0000 "application " | INHALATION_SPRAY | CUTANEOUS | Status: DC | PRN
  Administered 2020-07-05 – 2020-07-06 (×2): 1 via TOPICAL
  Filled 2020-07-05 (×2): qty 56

## 2020-07-05 NOTE — Anesthesia Postprocedure Evaluation (Signed)
Anesthesia Post Note  Patient: Joy Wood  Procedure(s) Performed: AN AD HOC LABOR EPIDURAL     Patient location during evaluation: Mother Baby Anesthesia Type: Epidural Level of consciousness: awake and alert and oriented Pain management: satisfactory to patient Vital Signs Assessment: post-procedure vital signs reviewed and stable Respiratory status: respiratory function stable Cardiovascular status: stable Postop Assessment: no headache, no backache, epidural receding, patient able to bend at knees, no signs of nausea or vomiting, adequate PO intake and able to ambulate Anesthetic complications: no   No complications documented.  Last Vitals:  Vitals:   07/05/20 1131 07/05/20 1212  BP: 131/73 128/74  Pulse: 68 74  Resp:  17  Temp:  37.3 C  SpO2:      Last Pain:  Vitals:   07/05/20 1212  TempSrc: Oral  PainSc:    Pain Goal: Patients Stated Pain Goal: 0 (07/05/20 0410)              Epidural/Spinal Function Cutaneous sensation: Able to Wiggle Toes (07/05/20 1213), Patient able to flex knees: Yes (07/05/20 1213), Patient able to lift hips off bed: Yes (07/05/20 1213), Back pain beyond tenderness at insertion site: No (07/05/20 1213), Progressively worsening motor and/or sensory loss: No (07/05/20 1213), Bowel and/or bladder incontinence post epidural: No (07/05/20 1213)  [name]

## 2020-07-05 NOTE — Lactation Note (Signed)
This note was copied from a baby's chart. Lactation Consultation Note  Patient Name: Joy Wood [id number] Date: 07/05/2020 Reason for consult: Initial assessment;Mother's request;Difficult latch;Term;Nipple pain/trauma;Maternal endocrine disorder Age:26 hours   Infant showing cues on arrival. Mom stated latch painful with infant shallow on the nipple. Infant has high bubble palate, arched. With suck training infant able to extend tongue and it can pass the gum line.   LC assisted Mom with latching using a chin tug, reclining position in football to get a deeper latch. Mom set up on manual pump to pre pump to extend the nipple prior to latching.   Mom's nipples are erect even more so with stimulation. Compression stripes noted but no other signs of trauma.  Mom to use coconut oil for nipple care.   Plan 1. To feed based on cues 8-12x in 24hr period no more than 4 hrs without an attempt. Mom to offer both breasts and look for swallows with breast compression.          2. Mom to offer EBM via spoon or finger if not able to get infant to latch.          3. Mom to use manual pump as stated above for 5-10 minutes before latching.           4. I and O sheet reviewed           5. [facility] brochure of inpatient and outpatient services reviewed.  All questions answered at the end of the visit.   Mom in communication with WIC in [facility] and will call in the am.  Maternal Data Has patient been taught Hand Expression?: Yes Does the patient have breastfeeding experience prior to this delivery?: Yes How long did the patient breastfeed?: 1 month, infant had a milk allergy ( Mom used nutramigen )  Feeding Mother's Current Feeding Choice: Breast Milk  LATCH Score Latch: Repeated attempts needed to sustain latch, nipple held in mouth throughout feeding, stimulation needed to elicit sucking reflex.  Audible Swallowing: Spontaneous and intermittent  Type of Nipple: Everted at rest and after  stimulation  Comfort (Breast/Nipple): Filling, red/small blisters or bruises, mild/mod discomfort  Hold (Positioning): Assistance needed to correctly position infant at breast and maintain latch.  LATCH Score: 7   Lactation Tools Discussed/Used Tools: Pump;Flanges;Coconut oil Flange Size: 27 Breast pump type: Manual Pump Education: Setup, frequency, and cleaning;Milk Storage Reason for Pumping: increase stimulation Pumping frequency: Mom to pre pump 5- 10 minutes to extend her nipple  Interventions Interventions: Breast feeding basics reviewed;Breast compression;Assisted with latch;Adjust position;Skin to skin;Support pillows;Breast massage;Position options;Hand express;Expressed milk;Education;Pre-pump if needed;Coconut oil;Hand pump  Discharge Pump: Manual WIC Program: Yes (Mom in communication with WIC and will call in the am.)  Consult Status Consult Status: Follow-up Date: 07/06/20 Follow-up type: In-patient    Urie Loughner  Nicholson-Springer 07/05/2020, 8:18 PM

## 2020-07-05 NOTE — MAU Note (Signed)
Patient presents to MAU complaining of SROM at 3:30am brownish  103/63 with contractions every 3-5 minutes. GBS pos

## 2020-07-05 NOTE — Social Work (Signed)
MOB was referred for history of anxiety.   * Referral screened out by Clinical Social Worker because none of the following criteria appear to apply:  ~ History of anxiety/depression during this pregnancy, or of post-partum depression following prior delivery. ~ Diagnosis of anxiety and/or depression within last 3 years. MOB previously denied having a diagnosis of anxiety. MOB previously (2020) attributed any anxiety to "it being a first pregnancy and not knowing what to expect."  OR * MOB's symptoms currently being treated with medication and/or therapy.  Please contact the Clinical Social Worker if needs arise, by [facility] request, or if MOB scores greater than 9/yes to question 10 on Edinburgh Postpartum Depression Screen.  Manfred Arch, LCSWA Clinical Social Work [employer] and [employer]  [phone]

## 2020-07-05 NOTE — Lactation Note (Signed)
This note was copied from a baby's chart. Lactation Consultation Note  Patient Name: Joy Wood [id number] Date: 07/05/2020 Reason for consult: L&D Initial assessment Age:26 hours  P2, FOB holding baby swaddled in his arms.  Per parents baby recently breastfed. Mother wanted LC to check infant's mouth for a "tongue tie".   Noted baby protruded tongue out of mouth.  Oral assessment with gloved finger did not note any lingual frenulum tightness. Lactation to follow up on family on MBU.  Maternal Data Does the patient have breastfeeding experience prior to this delivery?: Yes How long did the patient breastfeed?: 1 month    LATCH Score Latch: Grasps breast easily, tongue down, lips flanged, rhythmical sucking.  Audible Swallowing: Spontaneous and intermittent  Type of Nipple: Everted at rest and after stimulation  Comfort (Breast/Nipple): Soft / non-tender  Hold (Positioning): No assistance needed to correctly position infant at breast.  LATCH Score: 10   Interventions Interventions: Education  Discharge    Consult Status Consult Status: Follow-up Date: 07/05/20 Follow-up type: In-patient    Dahlia Byes [facility] 07/05/2020, 11:17 AM

## 2020-07-05 NOTE — Progress Notes (Signed)
Patient was C/C/+1 and pushed for approx 50 minutes with epidural.   Delivery of head without delivery of shoulders with gentle downward traction.  Legs in [place] and suprapubic pressure applied from right to left.  Attempted delivery of posterior arm unsuccessful, but posterior shoulder advanced allowing anterior left shoulder to dislodge and delivery thereafter with additional downward traction.  Left arm showing some mild weakness, good grasp per NICU assessment.  Discussed with patient. NSVD female infant, Apgars 6/8, weight pending.   The patient had 1st degree laceration repaired with 3-0 vicryl. Fundus was firm. EBL was expected amount. Placenta was delivered intact. Vagina was clear.  Delayed cord clamping not performed given shoulder dystocia and meconium.  Cord was immediately clampsed cut, and baby was handed off to awaiting neonatology. Baby was vigorous and doing skin to skin with mother at the time of my departure.  Joy Wood

## 2020-07-05 NOTE — H&P (Signed)
25 y.o. [redacted]w[redacted]d  G2P1000 comes in c/o SROM.  Found to be ruptured, having contractions every 3-94min.  Otherwise has good fetal movement and no bleeding.  Past Medical History:  Diagnosis Date  . Anemia 2017   iron deficiency  . Anxiety   . Diabetes mellitus without complication (HCC)    gestational; take metformin  . Hyperlipidemia 2018  . Menorrhagia with irregular cycle   . Onychomycosis of toenail 04/30/2017  . PCOS (polycystic ovarian syndrome) 2018   Diagnosed in [place].  History of ovarian cysts there with weight issues and insulin resistance.  . Weight gain     Past Surgical History:  Procedure Laterality Date  . WISDOM TOOTH EXTRACTION      OB History  Gravida Para Term Preterm AB Living  2 1 1         SAB IAB Ectopic Multiple Live Births               # Outcome Date GA Lbr Len/2nd Weight Sex Delivery Anes PTL Lv  2 Current           1 Term 2020     Vag-Spont       Social History   Socioeconomic History  . Marital status: Significant Other    Spouse name: Not on file  . Number of children: 0  . Years of education: [employer]  . Highest education level: Not on file  Occupational History  . Occupation: housewife  Tobacco Use  . Smoking status: Former 2021  . Smokeless tobacco: Never Used  . Tobacco comment: Hookah--twice weekly  Vaping Use  . Vaping Use: Never used  Substance and Sexual Activity  . Alcohol use: No  . Drug use: No  . Sexual activity: Yes  Other Topics Concern  . Not on file  Social History Narrative   Lives in [place]   Married and bored at home still (2018).     Would like to be doing makeup   Social Determinants of 05-01-1979 Strain: Not on file  Food Insecurity: Not on file  Transportation Needs: Not on file  Physical Activity: Not on file  Stress: Not on file  Social Connections: Not on file  Intimate Partner Violence: Not on file   Patient has no known allergies.    Prenatal Transfer Tool   Maternal Diabetes: No Genetic Screening: Normal Maternal Ultrasounds/Referrals: Normal Fetal Ultrasounds or other Referrals:  None Maternal Substance Abuse:  No Significant Maternal Medications:  None Significant Maternal Lab Results: Group B Strep positive  Other PNC: uncomplicated. H/o GDMA prior pregnancy, normal 1hr this pregnancy    Vitals:   07/05/20 0646 07/05/20 0657 07/05/20 0701 07/05/20 0705  BP: 138/80 (!) 144/74 128/78 128/72  Pulse: 86 86 80 70  Resp:      Temp:      TempSrc:      SpO2:        Lungs/Cor:  NAD Abdomen:  soft, gravid Ex:  no cords, erythema SVE:  3.5/8/-2 at admission, no 9.5 FHTs:  150, mod var, no current decels, but on review of strip prior to taking over, pt had post epidural decels treated with position change and phenylephrine, which then improved, was redosed with epidural and had 09/04/20 prolonged decel that again recovered.  Per RN meconium noted.   Toco:  q 2-4   A/P   Admitted with SROM S/p epidural, comfortable progressed quickly to 9.5, will monitor and plan to  have nicu in room for meconium fluid  GBS Pos, s/p PCN, due for next dose at approx 9am  EFW approx 8.5-9#  Other routine care  Philip Aspen

## 2020-07-05 NOTE — Anesthesia Preprocedure Evaluation (Signed)
Anesthesia Evaluation  Patient identified by MRN, date of birth, ID band Patient awake    Reviewed: Allergy & Precautions, NPO status , Patient's Chart, lab work & pertinent test results  Airway Mallampati: II  TM Distance: >3 FB Neck ROM: Full    Dental no notable dental hx.    Pulmonary neg pulmonary ROS, former smoker,    Pulmonary exam normal breath sounds clear to auscultation       Cardiovascular negative cardio ROS Normal cardiovascular exam Rhythm:Regular Rate:Normal     Neuro/Psych Anxiety negative neurological ROS  negative psych ROS   GI/Hepatic negative GI ROS, Neg liver ROS,   Endo/Other  negative endocrine ROSdiabetes, Gestational, Oral Hypoglycemic AgentsMorbid obesity  Renal/GU negative Renal ROS  negative genitourinary   Musculoskeletal negative musculoskeletal ROS (+)   Abdominal (+) + obese,   Peds negative pediatric ROS (+)  Hematology negative hematology ROS (+) anemia ,   Anesthesia Other Findings   Reproductive/Obstetrics (+) Pregnancy                             Anesthesia Physical  Anesthesia Plan  ASA: III  Anesthesia Plan: Epidural   Post-op Pain Management:    Induction:   PONV Risk Score and Plan: 2 and Treatment may vary due to age or medical condition  Airway Management Planned: Natural Airway  Additional Equipment: None  Intra-op Plan:   Post-operative Plan:   Informed Consent: I have reviewed the patients History and Physical, chart, labs and discussed the procedure including the risks, benefits and alternatives for the proposed anesthesia with the patient or authorized representative who has indicated his/her understanding and acceptance.       Plan Discussed with: Anesthesiologist  Anesthesia Plan Comments:         Anesthesia Quick Evaluation

## 2020-07-05 NOTE — Anesthesia Procedure Notes (Signed)
Epidural Patient location during procedure: OB Start time: 07/05/2020 5:35 AM End time: 07/05/2020 5:45 AM  Staffing Anesthesiologist: Mellody Dance, MD Performed: anesthesiologist   Preanesthetic Checklist Completed: patient identified, IV checked, site marked, risks and benefits discussed, monitors and equipment checked, pre-op evaluation and timeout performed  Epidural Patient position: sitting Prep: DuraPrep Patient monitoring: heart rate, cardiac monitor, continuous pulse ox and blood pressure Approach: midline Location: L2-L3 Injection technique: LOR saline  Needle:  Needle type: Tuohy  Needle gauge: 17 G Needle length: 9 cm Needle insertion depth: 5 cm Catheter type: closed end flexible Catheter size: 20 Guage Catheter at skin depth: 10 cm Test dose: negative and Other  Assessment Events: blood not aspirated, injection not painful, no injection resistance and negative IV test  Additional Notes Informed consent obtained prior to proceeding including risk of failure, 1% risk of PDPH, risk of minor discomfort and bruising.  Discussed rare but serious complications including epidural abscess, permanent nerve injury, epidural hematoma.  Discussed alternatives to epidural analgesia and patient desires to proceed.  Timeout performed pre-procedure verifying patient name, procedure, and platelet count.  Patient tolerated procedure well.

## 2020-07-06 LAB — CBC
HCT: 29.2 % — ABNORMAL LOW (ref 36.0–46.0)
Hemoglobin: 9.2 g/dL — ABNORMAL LOW (ref 12.0–15.0)
MCH: 24.9 pg — ABNORMAL LOW (ref 26.0–34.0)
MCHC: 31.5 g/dL (ref 30.0–36.0)
MCV: 79.1 fL — ABNORMAL LOW (ref 80.0–100.0)
Platelets: 235 10*3/uL (ref 150–400)
RBC: 3.69 MIL/uL — ABNORMAL LOW (ref 3.87–5.11)
RDW: 17.2 % — ABNORMAL HIGH (ref 11.5–15.5)
WBC: 12.3 10*3/uL — ABNORMAL HIGH (ref 4.0–10.5)
nRBC: 0 % (ref 0.0–0.2)

## 2020-07-06 MED ORDER — FERROUS SULFATE 325 (65 FE) MG PO TABS
325.0000 mg | ORAL_TABLET | Freq: Every day | ORAL | Status: DC
  Administered 2020-07-06: 325 mg via ORAL
  Filled 2020-07-06: qty 1

## 2020-07-06 MED ORDER — RHO D IMMUNE GLOBULIN 1500 UNIT/2ML IJ SOSY
300.0000 ug | PREFILLED_SYRINGE | Freq: Once | INTRAMUSCULAR | Status: AC
  Administered 2020-07-06: 300 ug via INTRAVENOUS
  Filled 2020-07-06: qty 2

## 2020-07-06 MED ORDER — FAMOTIDINE 20 MG PO TABS
20.0000 mg | ORAL_TABLET | Freq: Two times a day (BID) | ORAL | Status: DC | PRN
  Administered 2020-07-06: 20 mg via ORAL
  Filled 2020-07-06: qty 1

## 2020-07-06 NOTE — Lactation Note (Signed)
This note was copied from a baby's chart. Lactation Consultation Note  Patient Name: Joy Wood [id number] Date: 07/06/2020 Reason for consult: Follow-up assessment;Term;Infant weight loss;Other (Comment) (3 % weight loss, per mom supplemented early this am due to being tired.) Age:26 hours  Per mom last feeding was formula - LC noted 10 ml at 0622.  LC offered to place baby STS. LC checked the diaper and it was dry.  LC placed baby STS , laid back , and baby started rooting. LC offered to assist  To latch . Latch score of 9 and per mom cramping , swallows noted. Baby still feeding at 13 mins.  LC recommended to mom  prior to latching to empty bladder to decrease cramping at feeding. Offer both breast , if baby is satisfied, hold off on supplementing. If supplementing keep it low.    Maternal Data Has patient been taught Hand Expression?: Yes  Feeding Mother's Current Feeding Choice: Breast Milk and Formula Nipple Type: Slow - flow  LATCH Score Latch: Grasps breast easily, tongue down, lips flanged, rhythmical sucking. (laid back position / left breast)  Audible Swallowing: Spontaneous and intermittent  Type of Nipple: Everted at rest and after stimulation  Comfort (Breast/Nipple): Soft / non-tender (per mom comfortable with the latch / cramping)  Hold (Positioning): Assistance needed to correctly position infant at breast and maintain latch.  LATCH Score: 9   Lactation Tools Discussed/Used Tools: Pump;Flanges Flange Size: 24;27 Breast pump type: Manual  Interventions Interventions: Breast feeding basics reviewed;Assisted with latch;Skin to skin;Breast massage;Hand express;Breast compression;Adjust position;Support pillows;Expressed milk;Position options;Hand pump;Education  Discharge Pump: Manual WIC Program: Yes  Consult Status Consult Status: Follow-up Date: 07/07/20 Follow-up type: In-patient    Joy Wood 07/06/2020, 10:16 AM

## 2020-07-06 NOTE — Progress Notes (Signed)
Patient is eating, ambulating, voiding.  Pain control is good.  Vitals:   07/05/20 1802 07/05/20 2105 07/06/20 0005 07/06/20 0536  BP: 125/80 130/70 (!) 143/75 (!) 142/77  Pulse: 72 89 85 78  Resp: 20 18 18 18   Temp: 98 F (36.7 C) 99.4 F (37.4 C) 98.8 F (37.1 C) 97.9 F (36.6 C)  TempSrc: Oral Oral Oral Oral  SpO2: 100% 100% 100% 100%    Fundus firm Perineum without swelling.  Lab Results  Component Value Date   WBC 12.3 (H) 07/06/2020   HGB 9.2 (L) 07/06/2020   HCT 29.2 (L) 07/06/2020   MCV 79.1 (L) 07/06/2020   PLT 235 07/06/2020    --/--/A NEG (04/05 0441)/RI  A/P Post partum day 1.  Routine care.  Expect d/c tomorrow.   Baby moving arms appropriately.  Baby Rh POS- mom needs Rhogam. Iron.   08-22-1994

## 2020-07-07 ENCOUNTER — Encounter (HOSPITAL_COMMUNITY): Payer: Self-pay | Admitting: Obstetrics and Gynecology

## 2020-07-07 LAB — RH IG WORKUP (INCLUDES ABO/RH)
ABO/RH(D): A NEG
Fetal Screen: NEGATIVE
Gestational Age(Wks): 39.4
Unit division: 0

## 2020-07-07 MED ORDER — IBUPROFEN 600 MG PO TABS: 600.0000 mg | ORAL_TABLET | Freq: Four times a day (QID) | ORAL | 0 refills | Status: DC

## 2020-07-07 NOTE — Lactation Note (Signed)
This note was copied from a baby's chart. Lactation Consultation Note  Patient Name: Girl Tamsin Nader GYJEH'U Date: 07/07/2020 Reason for consult: Follow-up assessment;Infant weight loss;Term Age:26 hours P 2 , for D/C today.  As LC entered the room , baby latched on the left breast with depth and swallows.  Per mom comfortable and denies sore nipples.  LC reviewed supply and demand / importance of offering the breast 1st, if the baby is still hungry after the 1st breast offer the 2nd breast,. If satisfied, hold off on the supplement. If not satisfied, since the weight loss is WNL , keep supplementing low.  Mom has the [facility] brochure with resource numbers , D/C education below.    Maternal Data Has patient been taught Hand Expression?: Yes  Feeding Mother's Current Feeding Choice: Breast Milk and Formula Nipple Type: Slow - flow  LATCH Score Latch:  (latched with depth / swallows)  Audible Swallowing:  (swallows noted)     Comfort (Breast/Nipple):  (per mom comfortable)  Hold (Positioning):  (mom latched the baby)      Lactation Tools Discussed/Used Tools: Pump;Flanges Flange Size: 24;27 Breast pump type: Manual Pump Education: Milk Storage Reason for Pumping: PRN  Interventions Interventions: Breast feeding basics reviewed;Hand pump;Education  Discharge Discharge Education: Engorgement and breast care;Warning signs for feeding baby Pump: Manual  Consult Status Consult Status: Complete Date: 07/07/20 Follow-up type: In-patient    Matilde Sprang Khalilah Hoke 07/07/2020, 11:38 AM

## 2020-07-07 NOTE — Lactation Note (Signed)
This note was copied from a baby's chart. Lactation Consultation Note Attempted to see mom. Mom was awake looking at her phone in a dark room. Introduced self. Asked mom if she needed any help BF mom stated no. Saw a lot of formula that baby has had. Asked mom to call for assistance if needed. Mom stated she was good.   Patient Name: Joy Wood [id number] Date: 07/07/2020   Age:26 hours  Maternal Data    Feeding    LATCH Score                    Lactation Tools Discussed/Used    Interventions    Discharge    Consult Status      Charyl Dancer 07/07/2020, 2:19 AM

## 2020-07-07 NOTE — Lactation Note (Signed)
This note was copied from a baby's chart. Lactation Consultation Note  Patient Name: Girl Bayli Quesinberry HAFBX'U Date: 07/07/2020 Reason for consult: Follow-up assessment;Term;Infant weight loss;Other (Comment) (5 % weight loss. mom sleeping on the couch and dad holding the baby in the bed. will F/U prior to D/C. baby has had all bottles since last evening.) Age:26 hours 5 % weight loss, @ 43 hours - Bili check - 10.7  Last baby was to the breast was 2006 last evening , and 4 bottles since. ( 15 -25 ml ) voids and stools WNL for age.   Maternal Data    Feeding Mother's Current Feeding Choice: Breast Milk and Formula  LATCH Score                    Lactation Tools Discussed/Used    Interventions    Discharge    Consult Status Consult Status: Follow-up Date: 07/07/20 Follow-up type: In-patient    Matilde Sprang Randel Hargens 07/07/2020, 9:55 AM

## 2020-07-07 NOTE — Discharge Summary (Signed)
Postpartum Discharge Summary  Date of Service updated4/6/22     Patient Name: Joy Wood DOB: May 19, 1995 MRN: [id number]  Date of admission: 07/05/2020 Delivery date:07/05/2020  Delivering provider: Philip Aspen  Date of discharge: 07/07/2020  Admitting diagnosis: Normal labor and delivery [O80] Intrauterine pregnancy: [redacted]w[redacted]d     Secondary diagnosis:  Active Problems:   Normal labor and delivery  Additional problems: None    Discharge diagnosis: Term Pregnancy Delivered                                              Post partum procedures:rhogam Augmentation: N/A Complications: None--shoulder dystocia  Hospital course: Onset of Labor With Vaginal Delivery      25 y.o. yo [id number] at [redacted]w[redacted]d was admitted in Active Labor on 07/05/2020. Patient had an uncomplicated labor course as follows:  Membrane Rupture Time/Date: 3:30 AM ,07/05/2020   Delivery Method:Vaginal, Spontaneous  Episiotomy: None  Lacerations:  1st degree;Perineal  Patient had an uncomplicated postpartum course.  She is ambulating, tolerating a regular diet, passing flatus, and urinating well. Patient is discharged home in stable condition on 07/07/20.  Newborn Data: Birth date:07/05/2020  Birth time:9:20 AM  Gender:Female  Living status:Living  Apgars:6 ,9  Weight:3946 g   Rhophylac:Yes   Physical exam  Vitals:   07/06/20 0536 07/06/20 1536 07/06/20 2111 07/07/20 0510  BP: (!) 142/77 140/82 132/70 126/79  Pulse: 78 84 88 84  Resp: 18 16 18 18   Temp: 97.9 F (36.6 C) 98.4 F (36.9 C) 98.8 F (37.1 C) 98.7 F (37.1 C)  TempSrc: Oral Oral Oral Oral  SpO2: 100%  100% 100%   General: alert, cooperative and no distress Lochia: appropriate Uterine Fundus: firm Incision: N/A DVT Evaluation: No evidence of DVT seen on physical exam. Labs: Lab Results  Component Value Date   WBC 12.3 (H) 07/06/2020   HGB 9.2 (L) 07/06/2020   HCT 29.2 (L) 07/06/2020   MCV 79.1 (L) 07/06/2020   PLT 235 07/06/2020    CMP Latest Ref Rng & Units 01/10/2020  Glucose 70 - 99 mg/dL 03/11/2020)  BUN 6 - 20 mg/dL 8  Creatinine 630(Z - 6.01 mg/dL 0.93  Sodium 2.35 - 573 mmol/L 135  Potassium 3.5 - 5.1 mmol/L 3.4(L)  Chloride 98 - 111 mmol/L 102  CO2 22 - 32 mmol/L 24  Calcium 8.9 - 10.3 mg/dL 220)  Total Protein 6.5 - 8.1 g/dL -  Total Bilirubin 0.3 - 1.2 mg/dL -  Alkaline Phos 38 - 2.5(K U/L -  AST 15 - 41 U/L -  ALT 0 - 44 U/L -   Edinburgh Score: Edinburgh Postnatal Depression Scale Screening Tool 07/05/2020  I have been able to laugh and see the funny side of things. 0  I have looked forward with enjoyment to things. 0  I have blamed myself unnecessarily when things went wrong. 1  I have been anxious or worried for no good reason. 0  I have felt scared or panicky for no good reason. 0  Things have been getting on top of me. 1  I have been so unhappy that I have had difficulty sleeping. 0  I have felt sad or miserable. 0  I have been so unhappy that I have been crying. 1  The thought of harming myself has occurred to me. 0  Edinburgh Postnatal Depression Scale Total  3      After visit meds:  Allergies as of 07/07/2020   No Known Allergies     Medication List    STOP taking these medications   doxylamine (Sleep) 25 MG tablet Commonly known as: UNISOM     TAKE these medications   acetaminophen 500 MG tablet Commonly known as: TYLENOL Take 500 mg by mouth every 6 (six) hours as needed for mild pain or headache.   budesonide 32 MCG/ACT nasal spray Commonly known as: RHINOCORT AQUA Place 1 spray into both nostrils daily.   calcium carbonate 500 MG chewable tablet Commonly known as: TUMS - dosed in mg elemental calcium Chew 2 tablets by mouth 2 (two) times daily as needed for indigestion or heartburn.   famotidine 20 MG tablet Commonly known as: PEPCID Take 20 mg by mouth daily as needed for heartburn or indigestion.   ibuprofen 600 MG tablet Commonly known as: ADVIL Take 1 tablet (600  mg total) by mouth every 6 (six) hours.   neomycin-polymyxin-hydrocortisone 3.5-10000-1 OTIC suspension Commonly known as: CORTISPORIN Place 3 drops into the right ear 4 (four) times daily.   prenatal multivitamin Tabs tablet Take 1 tablet by mouth daily at 12 noon.        Discharge home in stable condition Infant Feeding: Bottle Infant Disposition:home with mother Discharge instruction: per After Visit Summary and Postpartum booklet. Activity: Advance as tolerated. Pelvic rest for 6 weeks.  Diet: routine diet Postpartum Appointment:4 weeks Future Appointments:No future appointments. Follow up Visit:  Follow-up Information    Philip Aspen, DO Follow up in 4 week(s).   Specialty: Obstetrics and Gynecology Contact information: [address] [place] [address] [phone]                   07/07/2020 [facility] Lizabeth Leyden, MD

## 2020-07-07 NOTE — Progress Notes (Signed)
Patient is doing well.  She is ambulating, voiding, tolerating PO.  Pain control is good.  Lochia is appropriate  Vitals:   07/06/20 0536 07/06/20 1536 07/06/20 2111 07/07/20 0510  BP: (!) 142/77 140/82 132/70 126/79  Pulse: 78 84 88 84  Resp: 18 16 18 18   Temp: 97.9 F (36.6 C) 98.4 F (36.9 C) 98.8 F (37.1 C) 98.7 F (37.1 C)  TempSrc: Oral Oral Oral Oral  SpO2: 100%  100% 100%    NAD Fundus firm Ext: 1+ edema bilaterally, no tenderness  Lab Results  Component Value Date   WBC 12.3 (H) 07/06/2020   HGB 9.2 (L) 07/06/2020   HCT 29.2 (L) 07/06/2020   MCV 79.1 (L) 07/06/2020   PLT 235 07/06/2020    --/--/A NEG (04/05 0441)/RImmune  A/P 25 y.o. G2P2001 PPD#2 s/p TSVD. Routine care.   S/p rhogam Will discharge to home today    [facility] GEFFEL [place]

## 2020-08-13 ENCOUNTER — Other Ambulatory Visit: Payer: Self-pay | Admitting: Obstetrics and Gynecology

## 2020-08-16 ENCOUNTER — Other Ambulatory Visit: Payer: Self-pay | Admitting: Obstetrics and Gynecology

## 2020-08-16 DIAGNOSIS — R222 Localized swelling, mass and lump, trunk: Secondary | ICD-10-CM

## 2021-09-02 ENCOUNTER — Ambulatory Visit: Payer: Self-pay

## 2021-09-02 ENCOUNTER — Ambulatory Visit (INDEPENDENT_AMBULATORY_CARE_PROVIDER_SITE_OTHER): Payer: Medicaid Other | Admitting: Surgery

## 2021-09-02 DIAGNOSIS — M7061 Trochanteric bursitis, right hip: Secondary | ICD-10-CM | POA: Diagnosis not present

## 2021-09-02 DIAGNOSIS — G8929 Other chronic pain: Secondary | ICD-10-CM

## 2021-09-02 DIAGNOSIS — M5441 Lumbago with sciatica, right side: Secondary | ICD-10-CM

## 2021-09-02 DIAGNOSIS — M546 Pain in thoracic spine: Secondary | ICD-10-CM

## 2021-09-02 MED ORDER — IBUPROFEN 800 MG PO TABS: 800.0000 mg | ORAL_TABLET | Freq: Three times a day (TID) | ORAL | 0 refills | Status: AC | PRN

## 2021-09-02 NOTE — Progress Notes (Signed)
Office Visit Note   Patient: Joy Wood           Date of Birth: [birth year]           MRN: [id number] Visit Date: 09/02/2021              Requested by: Julieanne Manson, MD [address] [place],  [place] [address] PCP: Julieanne Manson, MD   Assessment & Plan: Visit Diagnoses:  1. Chronic midline low back pain with right-sided sciatica   2. Chronic midline thoracic back pain   3. Greater trochanteric bursitis of right hip     Plan: At this point recommend conservative treatment.  Sent in prescription for ibuprofen 800 mg to be taken every 8 hours as needed with food.  We will also have patient go to formal PT.  Follow-up in 4 weeks for recheck.  Make decision at that point as to whether or not further imaging studies are indicated.  Follow-Up Instructions: Return in about 4 weeks (around 09/30/2021) for with Corgan Mormile recheck back pain.   Orders:  Orders Placed This Encounter  Procedures   XR Thoracic Spine 2 View   XR Lumbar Spine 2-3 Views   Ambulatory referral to Physical Therapy   Meds ordered this encounter  Medications   ibuprofen (ADVIL) 800 MG tablet    Sig: Take 1 tablet (800 mg total) by mouth every 8 (eight) hours as needed for moderate pain (Must take with food).    Dispense:  60 tablet    Refill:  0      Procedures: No procedures performed   Clinical Data: No additional findings.   Subjective: Chief Complaint  Patient presents with   Middle Back - Pain   Lower Back - Pain    HPI 26 year old female is new patient to clinic comes in today with complaints of mid thoracolumbar pain and right hip pain.  Pay states that this has been ongoing for several months.  No injury.  She is wondering whether or not her back pain could related to an epidural that she had July 05, 2020 when she gave birth to her child.  States that her pain started a few months after that.  Pain aggravated when she is sitting or standing for prolonged periods.  Not complaining  of true lumbar radicular symptoms.  Has some pain occasional pain in the right lateral hip and buttock.  No numbness and tingling in the legs.  No weakness. Review of Systems No current cardiopulmonary GI/GU  Objective: Vital Signs: There were no vitals taken for this visit.  Physical Exam HENT:     Head: Normocephalic.     Nose: Nose normal.  Eyes:     Extraocular Movements: Extraocular movements intact.  Pulmonary:     Effort: No respiratory distress.  Musculoskeletal:     Comments: Gait is normal.  She has mild tenderness around the T12-L2 spinous processes.  Negative logroll bilateral hips.  Negative straight leg raise.  She is mild tenderness over the right hip greater trochanter bursa.  Nontender on the left side.  No focal motor deficits.  Neurological:     Mental Status: She is alert and oriented to person, place, and time.  Psychiatric:        Mood and Affect: Mood normal.    Ortho Exam  Specialty Comments:  No specialty comments available.  Imaging: No results found.   PMFS History: Patient Active Problem List   Diagnosis Date Noted   Normal  labor and delivery 07/05/2020   Obesity (BMI 30.0-34.9) 07/16/2018   History of gestational diabetes 03/26/2018   History of prediabetes 05/04/2016   PCOS (polycystic ovarian syndrome) 04/03/2016   Hyperlipidemia 04/03/2016   Anemia 04/04/2015   Past Medical History:  Diagnosis Date   Anemia 2017   iron deficiency   Anxiety    Diabetes mellitus without complication (HCC)    gestational; take metformin   Hyperlipidemia 2018   Menorrhagia with irregular cycle    Onychomycosis of toenail 04/30/2017   PCOS (polycystic ovarian syndrome) 2018   Diagnosed in [place].  History of ovarian cysts there with weight issues and insulin resistance.   Weight gain     Family History  Problem Relation Age of Onset   Anemia Mother    Diabetes Father    Hyperlipidemia Father    Hypertension Father    Cancer Father     Past  Surgical History:  Procedure Laterality Date   WISDOM TOOTH EXTRACTION     Social History   Occupational History   Occupation: housewife  Tobacco Use   Smoking status: Former   Smokeless tobacco: Never   Tobacco comments:    Hookah--twice weekly  Vaping Use   Vaping Use: Never used  Substance and Sexual Activity   Alcohol use: No   Drug use: No   Sexual activity: Yes

## 2021-09-09 ENCOUNTER — Encounter: Payer: Self-pay | Admitting: Emergency Medicine

## 2021-09-09 ENCOUNTER — Ambulatory Visit
Admission: EM | Admit: 2021-09-09 | Discharge: 2021-09-09 | Disposition: A | Discharge status: Home / Self Care | Payer: Medicaid Other

## 2021-09-09 DIAGNOSIS — B084 Enteroviral vesicular stomatitis with exanthem: Secondary | ICD-10-CM | POA: Diagnosis not present

## 2021-09-09 NOTE — Discharge Instructions (Signed)
I have enclosed some information about hand-foot-and-mouth disease that I hope you find useful.  The mainstay of treatment for this virus is ibuprofen for pain and avoiding contact with others until you are no longer producing the lesions.  Typically this takes about 7 days.  Thank you for visiting urgent care today.  Please follow-up if you have not had meaningful improvement of your symptoms in the next 7 to 10 days.

## 2021-09-09 NOTE — ED Provider Notes (Signed)
[facility]    CSN: [id number] Arrival date & time: 09/09/21  1326    HISTORY   Chief Complaint  Patient presents with   Rash   Mouth Lesions   HPI Joy Wood is a 26 y.o. female. Patient presents to urgent care today complaining of lesions in her mouth, particularly on the tip of her tongue and the back part of the roof of her mouth along with a rash on her hands that is mostly itchy and has been present for the past 2 days.  Patient states she noticed a new lesion on the roof of her mouth this morning which is why she came in to be checked out.  Patient states she does not work outside the home.  Patient states she has 2 children, one currently attending school.  Patient states that child has been well.  The history is provided by the patient.   Past Medical History:  Diagnosis Date   Anemia 2017   iron deficiency   Anxiety    Diabetes mellitus without complication (HCC)    gestational; take metformin   Hyperlipidemia 2018   Menorrhagia with irregular cycle    Onychomycosis of toenail 04/30/2017   PCOS (polycystic ovarian syndrome) 2018   Diagnosed in [place].  History of ovarian cysts there with weight issues and insulin resistance.   Weight gain    Patient Active Problem List   Diagnosis Date Noted   Normal labor and delivery 07/05/2020   Obesity (BMI 30.0-34.9) 07/16/2018   History of gestational diabetes 03/26/2018   History of prediabetes 05/04/2016   PCOS (polycystic ovarian syndrome) 04/03/2016   Hyperlipidemia 04/03/2016   Anemia 04/04/2015   Past Surgical History:  Procedure Laterality Date   WISDOM TOOTH EXTRACTION     OB History     Gravida  2   Para  2   Term  2   Preterm      AB      Living  2      SAB      IAB      Ectopic      Multiple  0   Live Births  2          Home Medications    Prior to Admission medications   Medication Sig Start Date End Date Taking? Authorizing Provider  acetaminophen (TYLENOL)  500 MG tablet Take 500 mg by mouth every 6 (six) hours as needed for mild pain or headache.    [provider]  budesonide (RHINOCORT AQUA) 32 MCG/ACT nasal spray Place 1 spray into both nostrils daily. 02/11/20   [provider]  calcium carbonate (TUMS - DOSED IN MG ELEMENTAL CALCIUM) 500 MG chewable tablet Chew 2 tablets by mouth 2 (two) times daily as needed for indigestion or heartburn.    [provider]  famotidine (PEPCID) 20 MG tablet Take 20 mg by mouth daily as needed for heartburn or indigestion.    [provider]  ibuprofen (ADVIL) 800 MG tablet Take 1 tablet (800 mg total) by mouth every 8 (eight) hours as needed for moderate pain (Must take with food). 09/02/21   Naida Sleight, PA-C  neomycin-polymyxin-hydrocortisone (CORTISPORIN) 3.5-10000-1 OTIC suspension Place 3 drops into the right ear 4 (four) times daily. 06/09/20   [provider]  Prenatal Vit-Fe Fumarate-FA (PRENATAL MULTIVITAMIN) TABS tablet Take 1 tablet by mouth daily at 12 noon.    [provider]   Family History Family History  Problem  Relation Age of Onset   Anemia Mother    Diabetes Father    Hyperlipidemia Father    Hypertension Father    Cancer Father    Social History Social History   Tobacco Use   Smoking status: Former   Smokeless tobacco: Never   Tobacco comments:    Hookah--twice weekly  Vaping Use   Vaping Use: Never used  Substance Use Topics   Alcohol use: No   Drug use: No   Allergies   Patient has no known allergies.  Review of Systems Review of Systems Pertinent findings noted in history of present illness.   Physical Exam Triage Vital Signs ED Triage Vitals  Enc Vitals Group     BP 01/28/21 0827 (!) 147/82     Pulse Rate 01/28/21 0827 72     Resp 01/28/21 0827 18     Temp 01/28/21 0827 98.3 F (36.8 C)     Temp Source 01/28/21 0827 Oral     SpO2 01/28/21 0827 98 %     Weight --      Height --      Head Circumference --       Peak Flow --      Pain Score 01/28/21 0826 5     Pain Loc --      Pain Edu? --      Excl. in GC? --   No data found.  Updated Vital Signs BP 118/68   Pulse 74   Temp 98.1 F (36.7 C) (Oral)   Resp 20   SpO2 98%   Physical Exam Vitals and nursing note reviewed.  Constitutional:      General: She is awake. She is not in acute distress.    Appearance: Normal appearance. She is well-developed, well-groomed and overweight. She is not ill-appearing.  HENT:     Head: Normocephalic and atraumatic.     Salivary Glands: Right salivary gland is not diffusely enlarged or tender. Left salivary gland is not diffusely enlarged or tender.     Right Ear: Tympanic membrane, ear canal and external ear normal. No drainage. No middle ear effusion. There is no impacted cerumen. Tympanic membrane is not erythematous or bulging.     Left Ear: Tympanic membrane, ear canal and external ear normal. No drainage.  No middle ear effusion. There is no impacted cerumen. Tympanic membrane is not erythematous or bulging.     Nose: Nose normal. No nasal deformity, septal deviation, mucosal edema, congestion or rhinorrhea.     Right Turbinates: Not enlarged, swollen or pale.     Left Turbinates: Not enlarged, swollen or pale.     Right Sinus: No maxillary sinus tenderness or frontal sinus tenderness.     Left Sinus: No maxillary sinus tenderness or frontal sinus tenderness.     Mouth/Throat:     Lips: Pink. No lesions.     Mouth: Mucous membranes are moist. Oral lesions present.     Dentition: No dental caries or gum lesions.     Tongue: Lesions present.     Palate: Lesions present. No mass.     Pharynx: Oropharynx is clear. Uvula midline. No pharyngeal swelling, oropharyngeal exudate, posterior oropharyngeal erythema or uvula swelling.     Tonsils: No tonsillar exudate. 0 on the right. 0 on the left.  Eyes:     General: Lids are normal.        Right eye: No discharge.        Left eye: No discharge.  Extraocular Movements: Extraocular movements intact.     Conjunctiva/sclera: Conjunctivae normal.     Right eye: Right conjunctiva is not injected.     Left eye: Left conjunctiva is not injected.  Neck:     Trachea: Trachea and phonation normal.  Cardiovascular:     Rate and Rhythm: Normal rate and regular rhythm.     Pulses: Normal pulses.     Heart sounds: Normal heart sounds. No murmur heard.    No friction rub. No gallop.  Pulmonary:     Effort: Pulmonary effort is normal. No accessory muscle usage, prolonged expiration or respiratory distress.     Breath sounds: Normal breath sounds. No stridor, decreased air movement or transmitted upper airway sounds. No decreased breath sounds, wheezing, rhonchi or rales.  Chest:     Chest wall: No tenderness.  Musculoskeletal:        General: Normal range of motion.     Cervical back: Full passive range of motion without pain, normal range of motion and neck supple. Normal range of motion.  Lymphadenopathy:     Cervical: Cervical adenopathy present.  Skin:    General: Skin is warm and dry.     Findings: No erythema or rash.  Neurological:     General: No focal deficit present.     Mental Status: She is alert and oriented to person, place, and time.  Psychiatric:        Mood and Affect: Mood normal.        Behavior: Behavior normal. Behavior is cooperative.     Visual Acuity Right Eye Distance:   Left Eye Distance:   Bilateral Distance:    Right Eye Near:   Left Eye Near:    Bilateral Near:     UC Couse / Diagnostics / Procedures:    EKG  Radiology No results found.  Procedures Procedures (including critical care time)  UC Diagnoses / Final Clinical Impressions(s)   I have reviewed the triage vital signs and the nursing notes.  Pertinent labs & imaging results that were available during my care of the patient were reviewed by me and considered in my medical decision making (see chart for details).   Final diagnoses:   Hand, foot and mouth disease (HFMD)   Patient provided with patient education regarding hand-foot-and-mouth disease.  Ibuprofen recommended for pain.  Return precautions advised.  ED Prescriptions   None    PDMP not reviewed this encounter.  Pending results:  Labs Reviewed - No data to display  Medications Ordered in UC: Medications - No data to display  Disposition Upon Discharge:  Condition: stable for discharge home Home: take medications as prescribed; routine discharge instructions as discussed; follow up as advised.  Patient presented with an acute illness with associated systemic symptoms and significant discomfort requiring urgent management. In my opinion, this is a condition that a prudent lay person (someone who possesses an average knowledge of health and medicine) may potentially expect to result in complications if not addressed urgently such as respiratory distress, impairment of bodily function or dysfunction of bodily organs.   Routine symptom specific, illness specific and/or disease specific instructions were discussed with the patient and/or caregiver at length.   As such, the patient has been evaluated and assessed, work-up was performed and treatment was provided in alignment with urgent care protocols and evidence based medicine.  Patient/parent/caregiver has been advised that the patient may require follow up for further testing and treatment if the symptoms continue in spite  of treatment, as clinically indicated and appropriate.  If the patient was tested for COVID-19, Influenza and/or RSV, then the patient/parent/guardian was advised to isolate at home pending the results of his/her diagnostic coronavirus test and potentially longer if they're positive. I have also advised pt that if his/her COVID-19 test returns positive, it's recommended to self-isolate for at least 10 days after symptoms first appeared AND until fever-free for 24 hours without fever reducer  AND other symptoms have improved or resolved. Discussed self-isolation recommendations as well as instructions for household member/close contacts as per the [facility] and [place] DHHS, and also gave patient the COVID packet with this information.  Patient/parent/caregiver has been advised to return to the [facility] or PCP in 3-5 days if no better; to PCP or the Emergency Department if new signs and symptoms develop, or if the current signs or symptoms continue to change or worsen for further workup, evaluation and treatment as clinically indicated and appropriate  The patient will follow up with their current PCP if and as advised. If the patient does not currently have a PCP we will assist them in obtaining one.   The patient may need specialty follow up if the symptoms continue, in spite of conservative treatment and management, for further workup, evaluation, consultation and treatment as clinically indicated and appropriate.  Patient/parent/caregiver verbalized understanding and agreement of plan as discussed.  All questions were addressed during visit.  Please see discharge instructions below for further details of plan.  Discharge Instructions:   Discharge Instructions      I have enclosed some information about hand-foot-and-mouth disease that I hope you find useful.  The mainstay of treatment for this virus is ibuprofen for pain and avoiding contact with others until you are no longer producing the lesions.  Typically this takes about 7 days.  Thank you for visiting urgent care today.  Please follow-up if you have not had meaningful improvement of your symptoms in the next 7 to 10 days.      This office note has been dictated using Teaching laboratory technician.  Unfortunately, and despite my best efforts, this method of dictation can sometimes lead to occasional typographical or grammatical errors.  I apologize in advance if this occurs.     Theadora Rama Scales, PA-C 09/09/21 [phone]

## 2021-09-09 NOTE — ED Triage Notes (Signed)
Pt here with mouth pain and lesions along with rash on hands x 2 days.

## 2021-09-14 ENCOUNTER — Ambulatory Visit: Payer: Medicaid Other | Attending: Surgery

## 2021-09-14 DIAGNOSIS — G8929 Other chronic pain: Secondary | ICD-10-CM | POA: Insufficient documentation

## 2021-09-14 DIAGNOSIS — M5459 Other low back pain: Secondary | ICD-10-CM | POA: Diagnosis present

## 2021-09-14 DIAGNOSIS — M6281 Muscle weakness (generalized): Secondary | ICD-10-CM | POA: Diagnosis present

## 2021-09-14 DIAGNOSIS — M546 Pain in thoracic spine: Secondary | ICD-10-CM | POA: Diagnosis not present

## 2021-09-14 DIAGNOSIS — M7061 Trochanteric bursitis, right hip: Secondary | ICD-10-CM

## 2021-09-14 NOTE — Therapy (Signed)
OUTPATIENT PHYSICAL THERAPY THORACOLUMBAR EVALUATION   Patient Name: Joy Wood I Strawderman MRN: [id number] DOB:09/23/1995, 26 y.o., female Today's Date: 09/14/2021   PT End of Session - 09/14/21 1428     Visit Number 1    Number of Visits 6    Date for PT Re-Evaluation 10/26/21    Authorization Type [place] [facility]    PT Start Time 1405    PT Stop Time 1445    PT Time Calculation (min) 40 min    Activity Tolerance Patient tolerated treatment well    Behavior During Therapy [facility] for tasks assessed/performed             Past Medical History:  Diagnosis Date   Anemia 2017   iron deficiency   Anxiety    Diabetes mellitus without complication (HCC)    gestational; take metformin   Hyperlipidemia 2018   Menorrhagia with irregular cycle    Onychomycosis of toenail 04/30/2017   PCOS (polycystic ovarian syndrome) 2018   Diagnosed in [place].  History of ovarian cysts there with weight issues and insulin resistance.   Weight gain    Past Surgical History:  Procedure Laterality Date   WISDOM TOOTH EXTRACTION     Patient Active Problem List   Diagnosis Date Noted   Normal labor and delivery 07/05/2020   Obesity (BMI 30.0-34.9) 07/16/2018   History of gestational diabetes 03/26/2018   History of prediabetes 05/04/2016   PCOS (polycystic ovarian syndrome) 04/03/2016   Hyperlipidemia 04/03/2016   Anemia 04/04/2015    PCP: Julieanne MansonMulberry, Elizabeth,   REFERRING PROVIDER: Naida Sleightwens, James M,   REFERRING DIAG: W09.8,J19.1454.6,G89.29 (ICD-10-CM) - Chronic midline thoracic back pain M70.61 (ICD-10-CM) - Greater trochanteric bursitis of right hip   Rationale for Evaluation and Treatment Rehabilitation  THERAPY DIAG: M54.6,G89.29 (ICD-10-CM) - Chronic midline thoracic back pain M70.61 (ICD-10-CM) - Greater trochanteric bursitis of right hip    ONSET DATE: 07/05/20  SUBJECTIVE:                                                                                                                                                                                            SUBJECTIVE STATEMENT: Describes low back pain with prolonged sitting/standing PERTINENT HISTORY:  HPI 26 year old female is new patient to clinic comes in today with complaints of mid thoracolumbar pain and right hip pain.  Pay states that this has been ongoing for several months.  No injury.  She is wondering whether or not her back pain could related to an epidural that she had July 05, 2020 when she gave birth to her child.  States that her pain started a few months after  that.  Pain aggravated when she is sitting or standing for prolonged periods.  Not complaining of true lumbar radicular symptoms.  Has some pain occasional pain in the right lateral hip and buttock.  No numbness and tingling in the legs.  No weakness. Review of Systems No current cardiopulmonary   PAIN:  Are you having pain? Yes: NPRS scale: 8/10 Pain location: low back Pain description: ache Aggravating factors: prolonged positions Relieving factors: position changes   PRECAUTIONS: none  WEIGHT BEARING RESTRICTIONS No  FALLS:  Has patient fallen in last 6 months? No  LIVING ENVIRONMENT: Lives with: lives with their family Lives in: House/apartment Stairs:  yes   OCCUPATION: housewife  PLOF: Independent  PATIENT GOALS To reduce and manage my pain   OBJECTIVE:   DIAGNOSTIC FINDINGS:  None available  PATIENT SURVEYS:  Modified Oswestry 12/45   SCREENING FOR RED FLAGS: Bowel or bladder incontinence: No  COGNITION:  Overall cognitive status: Within functional limits for tasks assessed     SENSATION: Not tested  MUSCLE LENGTH: Hamstrings: Right 80d deg; Left 80d deg Thomas test:unremarkable  POSTURE: No Significant postural limitations  PALPATION: Mild tenderness to L4-5 spinous processes  LUMBAR ROM:   Active  A/PROM  eval  Flexion 50%  Extension 90%  Right lateral flexion 90%  Left lateral flexion 90%  Right rotation 90%  Left  rotation 90%   (Blank rows = not tested)  LOWER EXTREMITY ROM:   WNL throughout  Active  Right eval Left eval  Hip flexion    Hip extension    Hip abduction    Hip adduction    Hip internal rotation    Hip external rotation    Knee flexion    Knee extension    Ankle dorsiflexion    Ankle plantarflexion    Ankle inversion    Ankle eversion     (Blank rows = not tested)  LOWER EXTREMITY MMT:  WFL throughout  MMT Right eval Left eval  Hip flexion    Hip extension    Hip abduction    Hip adduction    Hip internal rotation    Hip external rotation    Knee flexion    Knee extension    Ankle dorsiflexion    Ankle plantarflexion    Ankle inversion    Ankle eversion     (Blank rows = not tested)  LUMBAR SPECIAL TESTS:  Straight leg raise test: Negative, Slump test: Negative, and FABER test: Negative  FUNCTIONAL TESTS:  5 times sit to stand: 8s  GAIT: Distance walked: 69ft x2 Assistive device utilized: None Level of assistance: Complete Independence Comments:     TODAY'S TREATMENT  Eval   PATIENT EDUCATION:  Education details: Discussed eval findings, rehab rationale and POC and patient is in agreement  Person educated: Patient Education method: Medical illustrator Education comprehension: verbalized understanding, returned demonstration, and needs further education   HOME EXERCISE PROGRAM: Access Code: [email or web address] URL: [email or web address] Date: 09/14/2021 Prepared by: Gustavus Bryant  Exercises - Supine Piriformis Stretch  - 2 x daily - 7 x weekly - 1 sets - 3 reps - 30s hold - Curl Up with Arms Crossed  - 2 x daily - 7 x weekly - 1 sets - 15 reps  ASSESSMENT:  CLINICAL IMPRESSION: Patient is a 26 y.o. female who was seen today for physical therapy evaluation and treatment for Low back and R hip pain.  Today R hip pain 0/10.  Patient shows ROM limitations in  FF only, core strength deficits noted, no neuro tension signs  elicited, good overall posture, point tender to R piriformis with mild discomfort with stretching.   OBJECTIVE IMPAIRMENTS decreased endurance, decreased ROM, and decreased strength.   ACTIVITY LIMITATIONS bending, sitting, and standing    PERSONAL FACTORS Age, Fitness, and Time since onset of injury/illness/exacerbation are also affecting patient's functional outcome.   REHAB POTENTIAL: Good  CLINICAL DECISION MAKING: Stable/uncomplicated  EVALUATION COMPLEXITY: Low   GOALS: Goals reviewed with patient? Yes  SHORT TERM GOALS: STGs=LTGs    LONG TERM GOALS: Target date: 10/26/2021  Patient to demonstrate independence in HEP Baseline: ADCRNPCG Goal status: INITIAL  2.  Decrease worst pain to 4/10 Baseline: 8/10 Goal status: INITIAL  3.  90% AROM lumbar flexion Baseline: 50% Goal status: INITIAL  4.  Decrease ODI score to 10/45 Baseline: 12/45 Goal status: INITIAL    PLAN: PT FREQUENCY: 1x/week  PT DURATION: 6 weeks  PLANNED INTERVENTIONS: Therapeutic exercises, Therapeutic activity, Neuromuscular re-education, Balance training, Gait training, Patient/Family education, Joint mobilization, Manual therapy, and Re-evaluation.  PLAN FOR NEXT SESSION: HEP update, R piriformis release, core strengthening, aerobic work.   Hildred Laser, PT 09/14/2021, 2:29 PM   [employer] Authorization   Choose one: Rehabilitative  Standardized Assessment or Functional Outcome Tool: See Pain Assessment and Oswestry  Score or Percent Disability: 27%  Body Parts Treated (Select each separately):  Lumbopelvic. Overall deficits/functional limitations for body part selected: mild   If treatment provided at initial evaluation, no treatment charged due to lack of authorization.

## 2021-09-28 ENCOUNTER — Ambulatory Visit: Payer: Medicaid Other

## 2021-10-05 ENCOUNTER — Ambulatory Visit: Payer: Medicaid Other

## 2021-10-06 ENCOUNTER — Ambulatory Visit: Payer: Medicaid Other | Admitting: Surgery

## 2021-10-12 ENCOUNTER — Ambulatory Visit: Payer: Medicaid Other

## 2021-11-02 ENCOUNTER — Telehealth: Payer: Self-pay | Admitting: Surgery

## 2021-11-02 NOTE — Telephone Encounter (Signed)
Joy Wood from [facility] called for plan of care that was faxed to [place] twice. Please send plan of care. [facility] phone number is [phone].

## 2021-11-02 NOTE — Telephone Encounter (Signed)
Do you know if we have received fax for plan of care for this patient?  Please let me know.

## 2021-11-02 NOTE — Telephone Encounter (Signed)
I have not seen this. Fayrene Fearing is given his faxes to sign when they come in but, I don't recall seeing this. They need to re-fax.

## 2021-11-02 NOTE — Telephone Encounter (Signed)
Talked with Emmit Alexanders and advised her to refax plan of care to our office again.  Fax number given

## 2021-11-02 NOTE — Telephone Encounter (Signed)
Please advise on message below.
